# Patient Record
Sex: Female | Born: 1965 | Race: White | Hispanic: No | Smoking: Former smoker
Health system: Southern US, Community
[De-identification: ages and names within clinical notes are randomized; demographics above are authoritative.]

## PROBLEM LIST (undated history)

## (undated) DIAGNOSIS — E7800 Pure hypercholesterolemia, unspecified: Secondary | ICD-10-CM

## (undated) DIAGNOSIS — R519 Headache, unspecified: Secondary | ICD-10-CM

## (undated) DIAGNOSIS — Z9889 Other specified postprocedural states: Secondary | ICD-10-CM

## (undated) DIAGNOSIS — Z7409 Other reduced mobility: Secondary | ICD-10-CM

## (undated) DIAGNOSIS — M1990 Unspecified osteoarthritis, unspecified site: Secondary | ICD-10-CM

## (undated) DIAGNOSIS — Z87442 Personal history of urinary calculi: Secondary | ICD-10-CM

## (undated) DIAGNOSIS — R51 Headache: Secondary | ICD-10-CM

## (undated) DIAGNOSIS — S329XXA Fracture of unspecified parts of lumbosacral spine and pelvis, initial encounter for closed fracture: Secondary | ICD-10-CM

## (undated) DIAGNOSIS — I10 Essential (primary) hypertension: Secondary | ICD-10-CM

## (undated) DIAGNOSIS — F329 Major depressive disorder, single episode, unspecified: Secondary | ICD-10-CM

## (undated) DIAGNOSIS — F32A Depression, unspecified: Secondary | ICD-10-CM

## (undated) HISTORY — PX: FRACTURE SURGERY: SHX138

## (undated) HISTORY — PX: DILATION AND CURETTAGE OF UTERUS: SHX78

## (undated) HISTORY — PX: FEMUR SURGERY: SHX943

## (undated) HISTORY — DX: Pure hypercholesterolemia, unspecified: E78.00

## (undated) HISTORY — PX: WISDOM TOOTH EXTRACTION: SHX21

---

## 1998-04-04 ENCOUNTER — Emergency Department (HOSPITAL_COMMUNITY): Admission: EM | Admit: 1998-04-04 | Discharge: 1998-04-04 | Payer: Self-pay | Admitting: Emergency Medicine

## 2001-09-05 ENCOUNTER — Encounter: Payer: Self-pay | Admitting: Emergency Medicine

## 2001-09-05 ENCOUNTER — Emergency Department (HOSPITAL_COMMUNITY): Admission: EM | Admit: 2001-09-05 | Discharge: 2001-09-05 | Payer: Self-pay | Admitting: Emergency Medicine

## 2002-01-20 ENCOUNTER — Encounter (INDEPENDENT_AMBULATORY_CARE_PROVIDER_SITE_OTHER): Payer: Self-pay

## 2002-01-20 ENCOUNTER — Ambulatory Visit (HOSPITAL_COMMUNITY): Admission: AD | Admit: 2002-01-20 | Discharge: 2002-01-20 | Payer: Self-pay | Admitting: Obstetrics and Gynecology

## 2002-12-18 ENCOUNTER — Inpatient Hospital Stay (HOSPITAL_COMMUNITY): Admission: AD | Admit: 2002-12-18 | Discharge: 2002-12-18 | Payer: Self-pay | Admitting: Obstetrics and Gynecology

## 2003-02-07 ENCOUNTER — Emergency Department (HOSPITAL_COMMUNITY): Admission: EM | Admit: 2003-02-07 | Discharge: 2003-02-07 | Payer: Self-pay | Admitting: Emergency Medicine

## 2003-02-08 ENCOUNTER — Inpatient Hospital Stay (HOSPITAL_COMMUNITY): Admission: AD | Admit: 2003-02-08 | Discharge: 2003-02-08 | Payer: Self-pay | Admitting: Obstetrics and Gynecology

## 2003-03-10 ENCOUNTER — Ambulatory Visit (HOSPITAL_COMMUNITY): Admission: RE | Admit: 2003-03-10 | Discharge: 2003-03-10 | Payer: Self-pay | Admitting: Obstetrics and Gynecology

## 2003-03-24 ENCOUNTER — Observation Stay (HOSPITAL_COMMUNITY): Admission: AD | Admit: 2003-03-24 | Discharge: 2003-03-25 | Payer: Self-pay | Admitting: Obstetrics and Gynecology

## 2003-03-24 ENCOUNTER — Encounter: Payer: Self-pay | Admitting: Obstetrics and Gynecology

## 2003-03-30 ENCOUNTER — Encounter: Payer: Self-pay | Admitting: Obstetrics and Gynecology

## 2003-03-30 ENCOUNTER — Inpatient Hospital Stay (HOSPITAL_COMMUNITY): Admission: AD | Admit: 2003-03-30 | Discharge: 2003-03-30 | Payer: Self-pay | Admitting: Obstetrics and Gynecology

## 2003-04-10 ENCOUNTER — Encounter: Payer: Self-pay | Admitting: Obstetrics and Gynecology

## 2003-04-10 ENCOUNTER — Inpatient Hospital Stay (HOSPITAL_COMMUNITY): Admission: AD | Admit: 2003-04-10 | Discharge: 2003-04-10 | Payer: Self-pay | Admitting: Obstetrics and Gynecology

## 2003-04-11 ENCOUNTER — Inpatient Hospital Stay (HOSPITAL_COMMUNITY): Admission: AD | Admit: 2003-04-11 | Discharge: 2003-04-11 | Payer: Self-pay | Admitting: Obstetrics and Gynecology

## 2003-04-12 ENCOUNTER — Inpatient Hospital Stay (HOSPITAL_COMMUNITY): Admission: AD | Admit: 2003-04-12 | Discharge: 2003-04-12 | Payer: Self-pay | Admitting: Obstetrics and Gynecology

## 2003-04-18 ENCOUNTER — Inpatient Hospital Stay (HOSPITAL_COMMUNITY): Admission: AD | Admit: 2003-04-18 | Discharge: 2003-04-18 | Payer: Self-pay | Admitting: Obstetrics and Gynecology

## 2003-04-19 ENCOUNTER — Inpatient Hospital Stay (HOSPITAL_COMMUNITY): Admission: AD | Admit: 2003-04-19 | Discharge: 2003-04-19 | Payer: Self-pay | Admitting: Obstetrics and Gynecology

## 2003-04-21 ENCOUNTER — Ambulatory Visit (HOSPITAL_COMMUNITY): Admission: RE | Admit: 2003-04-21 | Discharge: 2003-04-21 | Payer: Self-pay | Admitting: Obstetrics and Gynecology

## 2003-04-21 ENCOUNTER — Encounter: Payer: Self-pay | Admitting: Obstetrics and Gynecology

## 2003-04-21 ENCOUNTER — Inpatient Hospital Stay (HOSPITAL_COMMUNITY): Admission: AD | Admit: 2003-04-21 | Discharge: 2003-04-21 | Payer: Self-pay | Admitting: Obstetrics & Gynecology

## 2003-04-23 ENCOUNTER — Encounter: Payer: Self-pay | Admitting: Obstetrics and Gynecology

## 2003-04-23 ENCOUNTER — Inpatient Hospital Stay (HOSPITAL_COMMUNITY): Admission: AD | Admit: 2003-04-23 | Discharge: 2003-04-25 | Payer: Self-pay | Admitting: Obstetrics and Gynecology

## 2003-04-24 ENCOUNTER — Encounter: Payer: Self-pay | Admitting: Obstetrics and Gynecology

## 2003-04-30 ENCOUNTER — Inpatient Hospital Stay (HOSPITAL_COMMUNITY): Admission: AD | Admit: 2003-04-30 | Discharge: 2003-05-03 | Payer: Self-pay | Admitting: Obstetrics and Gynecology

## 2003-04-30 ENCOUNTER — Encounter (INDEPENDENT_AMBULATORY_CARE_PROVIDER_SITE_OTHER): Payer: Self-pay | Admitting: *Deleted

## 2003-05-04 ENCOUNTER — Encounter: Admission: RE | Admit: 2003-05-04 | Discharge: 2003-06-03 | Payer: Self-pay | Admitting: Obstetrics and Gynecology

## 2003-05-08 ENCOUNTER — Inpatient Hospital Stay (HOSPITAL_COMMUNITY): Admission: AD | Admit: 2003-05-08 | Discharge: 2003-05-08 | Payer: Self-pay | Admitting: *Deleted

## 2003-06-12 ENCOUNTER — Other Ambulatory Visit: Admission: RE | Admit: 2003-06-12 | Discharge: 2003-06-12 | Payer: Self-pay | Admitting: Obstetrics and Gynecology

## 2003-09-07 ENCOUNTER — Emergency Department (HOSPITAL_COMMUNITY): Admission: EM | Admit: 2003-09-07 | Discharge: 2003-09-07 | Payer: Self-pay | Admitting: Emergency Medicine

## 2003-10-24 ENCOUNTER — Emergency Department (HOSPITAL_COMMUNITY): Admission: AD | Admit: 2003-10-24 | Discharge: 2003-10-24 | Payer: Self-pay | Admitting: Family Medicine

## 2004-02-20 ENCOUNTER — Emergency Department (HOSPITAL_COMMUNITY): Admission: AD | Admit: 2004-02-20 | Discharge: 2004-02-20 | Payer: Self-pay | Admitting: Internal Medicine

## 2004-04-11 ENCOUNTER — Emergency Department (HOSPITAL_COMMUNITY): Admission: EM | Admit: 2004-04-11 | Discharge: 2004-04-11 | Payer: Self-pay | Admitting: Emergency Medicine

## 2005-01-06 ENCOUNTER — Emergency Department (HOSPITAL_COMMUNITY): Admission: EM | Admit: 2005-01-06 | Discharge: 2005-01-06 | Payer: Self-pay | Admitting: Family Medicine

## 2005-03-03 ENCOUNTER — Emergency Department (HOSPITAL_COMMUNITY): Admission: EM | Admit: 2005-03-03 | Discharge: 2005-03-03 | Payer: Self-pay | Admitting: Family Medicine

## 2005-05-11 ENCOUNTER — Emergency Department (HOSPITAL_COMMUNITY): Admission: EM | Admit: 2005-05-11 | Discharge: 2005-05-11 | Payer: Self-pay | Admitting: Emergency Medicine

## 2005-05-20 ENCOUNTER — Emergency Department (HOSPITAL_COMMUNITY): Admission: EM | Admit: 2005-05-20 | Discharge: 2005-05-21 | Payer: Self-pay | Admitting: Emergency Medicine

## 2005-06-18 ENCOUNTER — Emergency Department (HOSPITAL_COMMUNITY): Admission: EM | Admit: 2005-06-18 | Discharge: 2005-06-18 | Payer: Self-pay | Admitting: Family Medicine

## 2005-06-20 ENCOUNTER — Emergency Department (HOSPITAL_COMMUNITY): Admission: EM | Admit: 2005-06-20 | Discharge: 2005-06-20 | Payer: Self-pay | Admitting: Emergency Medicine

## 2005-06-29 ENCOUNTER — Ambulatory Visit (HOSPITAL_COMMUNITY): Admission: RE | Admit: 2005-06-29 | Discharge: 2005-06-29 | Payer: Self-pay | Admitting: Urology

## 2008-05-05 ENCOUNTER — Emergency Department (HOSPITAL_COMMUNITY): Admission: EM | Admit: 2008-05-05 | Discharge: 2008-05-05 | Payer: Self-pay | Admitting: Family Medicine

## 2008-08-05 ENCOUNTER — Emergency Department (HOSPITAL_COMMUNITY): Admission: EM | Admit: 2008-08-05 | Discharge: 2008-08-05 | Payer: Self-pay | Admitting: Family Medicine

## 2011-04-07 NOTE — H&P (Signed)
NAME:  Emily Romero, Emily Romero                        ACCOUNT NO.:  [id number]   MEDICAL RECORD NO.:  [id number]                   PATIENT TYPE:  INP   LOCATION:  NA                                   FACILITY:  [facility]   PHYSICIAN:  Juluis Mire, M.D.                DATE OF BIRTH:  11-28-1965   DATE OF ADMISSION:  DATE OF DISCHARGE:                                HISTORY & PHYSICAL   HISTORY OF PRESENT ILLNESS:  The patient is a 45 year old gravida 3, para 3,  abortus 1 white female who presents for repeat cesarean section.  Her last  menstrual period was August 28, 2002 giving her an estimated date of  confinement of June 04, 2003.  This gives her an estimated gestational age  of [redacted] weeks.  This is consistent with initial exam and prior ultrasound.   Her prenatal course has been complicated by elevated blood pressures.  We  felt that she probably had some component of chronic hypertension.  She has  been on labetalol for this.  She has also had low platelet counts.  Platelet  counts have varied from 112,000 up to 137,000.  During this time she has  been hospitalized several times for management of what we felt was toxemia.  Workup was completely negative except for the hypertension and the low  platelet counts.  Her liver function tests were normal.  Serial ultrasounds  and nonstress testing revealed no evidence of intrauterine placental  insufficiency.  Last Friday we did do an amniocentesis.  The LS was 2-1 with  no PG.  We have waited a week out but now we will proceed with a repeat  cesarean section.   ALLERGIES:  No known drug allergies.   MEDICATIONS:  1. Labetalol.  2. Prenatal vitamins.   PRENATAL LABS:  The patient is O negative.  Negative antibody screen.  Nonreactive serology.  Positive Rubella titer.  Positive Rubella titer.  Negative hepatitis B surface antigen.  A 50 g Glucola was within normal  limits.   PAST MEDICAL HISTORY:   FAMILY HISTORY:   SOCIAL HISTORY:   Please see prenatal records.  The only issue was she was at  risk for advanced maternal age and did have an amniocentesis with  ___________ chromosomally normal female.   REVIEW OF SYSTEMS:  Noncontributory.   PHYSICAL EXAMINATION:  VITAL SIGNS:  The patient's blood pressure is  160/100.  The vital signs are stable.  HEENT:  The patient normocephalic.  Pupils equal, round, and reactive to  light and accommodation.  Extraocular movements intact.  Sclerae and  conjunctivae clear.  Oropharynx clear.  NECK:  Without thyromegaly.  BREASTS:  ___________ but no discreet masses.  LUNGS:  Clear.  HEART:  Regular rate.  Grade 2/6 soft systolic ejection murmur.  ABDOMEN:  Gravid uterus consistent with dates.  PELVIC:  Examination deferred.  EXTREMITIES:  Trace edema.  NEUROLOGIC:  Grossly within normal limits.  Deep tendon reflexes 2+.  No  clonus.   IMPRESSION:  1. Intrauterine pregnancy at 35 weeks.  2. Chronic hypertension.  3. Thrombocytopenia.  4. Planned repeat cesarean section.   PLAN:  The patient will undergo repeat cesarean section.  The risks were  discussed including the small risk of pulmonary hematuria that could require  transfer of the infant to the intensive care unit for management.  The risks  of surgery have been discussed including the risk of infection.  The risk of  hemorrhage can necessitate transfusion with the risk of major hepatitis.  The risk of injury to adjacent organs including bladder, bowel, or ureters  could require further exploratory surgery.  The risks of ___________ and  pulmonary embolus.  The patient voiced understanding of the examination  risks.                                               Juluis Mire, M.D.    JSM/MEDQ  D:  04/30/2003  T:  04/30/2003  Job:  [id number]

## 2011-04-07 NOTE — Op Note (Signed)
[facility]  Patient:    Emily Romero, Emily Romero Visit Number: [id number] MRN: [id number]          Service Type: DSU Location: DAY Attending Physician:  Lendon Colonel Dictated by:   Kathie Rhodes. Kyra Manges, M.D. Proc. Date: 01/20/02 Admit Date:  01/20/2002 Discharge Date: 01/20/2002                             Operative Report  PREOPERATIVE DIAGNOSES:  Incomplete abortion.  POSTOPERATIVE DIAGNOSES:  Incomplete abortion.  OPERATION PERFORMED:  Suction curettage.  DESCRIPTION OF PROCEDURE:  Under satisfactory general anesthesia, the patient was prepped and draped in the usual fashion. Cervix dilated and the endometrial cavity was emptied with blood clots and retained products of conception. No unusual blood loss occurred. The patient tolerated the procedure well and was sent to the recovery room in good condition. Dictated by:   S. Kyra Manges, M.D. Attending Physician:  Lendon Colonel DD:  01/20/02 TD:  01/21/02 Job: [id number]

## 2011-04-07 NOTE — Op Note (Signed)
NAME:  Emily Romero, Emily Romero                        ACCOUNT NO.:  [id number]   MEDICAL RECORD NO.:  [id number]                   PATIENT TYPE:  INP   LOCATION:  9146                                 [facility]ACILITY:  [facility]   PHYSICIAN:  Juluis Mire, M.D.                DATE OF BIRTH:  Apr 11, 1966   DATE OF PROCEDURE:  04/30/2003  DATE OF DISCHARGE:                                 OPERATIVE REPORT   PREOPERATIVE DIAGNOSES:  1. Intrauterine pregnancy at 36 weeks.  2. Chronic hypertension.  3. Thrombocytopenia.  4. Repeat cesarean section.  5. Multiparity, desires sterility.   POSTOPERATIVE DIAGNOSES:  1. Intrauterine pregnancy at 36 weeks.  2. Chronic hypertension.  3. Thrombocytopenia.  4. Repeat cesarean section.  5. Multiparity, desires sterility.   OPERATIVE PROCEDURES:  1. Low transverse cesarean section.  2. Bilateral tubal ligation.   SURGEON:  Juluis Mire, M.D.   ANESTHESIA:  Spinal.   ESTIMATED BLOOD LOSS:  800 mL.   PACKS AND DRAINS:  None.   FLUIDS REPLACED:  There was no intraoperative blood placed.   COMPLICATIONS:  None.   INDICATIONS FOR PROCEDURE:  This is as noted in history and physical with  one addition.  The patient decided she was desirous of permanent  sterilization.  Potential irreversibility of sterilization is explained.  A  failure rate of 1 in 200 is quoted.  Failures can be in the form of ectopic  pregnancy requiring further surgical management.  Alternative forms of birth  control have been discussed.   DESCRIPTION OF PROCEDURE:  The patient was taken to the operating room and  placed in the supine position with left lateral tilt.  After satisfactory  level of spinal anesthesia was obtained, the abdomen was prepped out with  Betadine and draped as a sterile field.  A prior low transverse skin  incision was then excised. The incision was extended through subcutaneous  tissue.  Fascia was entered sharply and incision in the fascia extended  laterally.  The fascia was taken off the muscles superiorly and inferiorly.  Rectus muscles were separated in the midline.  Peritoneum was entered  sharply.  Incision in the peritoneum was extended both superiorly and  inferiorly.  A low transverse bladder flap was developed.  A low transverse  uterine incision was begun with the knife and extended laterally using  manual traction.  The infant was delivered with elevation _______ fundal  pressure.  Amniotic fluid was clear.  The infant was a viable female who  weighed 6 pounds 7 ounces, Apgars were 8/9.  Umbilical artery pH was 7.33.  Placenta was delivered manually and sent for pathology.  Uterus was then  exteriorized for closure.  Uterus closed with running locking suture of 0  chromic using a two-layer closure technique.  We had good hemostasis and  clear urine output.   The patient was desirous of permanent sterilization.  Mid  segment of each  tube was elevated with Babcock tenaculum.  A hole was made in the avascular  of the mesosalpinx using the bipolar.  Individual ligatures of 0 plain  catgut were used to ligate off a segment of tube.  The intervening segment  of tube was then excised.  Cut ends of the tube were then cauterized using  the Bovie.  We had good hemostasis.  Ovaries appeared to be normal.  Uterus  was returned to the abdominal cavity.  We thoroughly irrigated the pelvis.  We had excellent hemostasis.  Urine output remained clear and adequate.  Muscles reapproximated with running suture of 3-0 Vicryl.  Fascia was closed  with running suture of 0 PDS.  Skin was closed with staples and Steri-  Strips.  Sponge, needle and instrument counts were reported as correct by  the circulating nurse x2.  The patient tolerated the procedure well and was  returned to the recovery room in good condition.                                               Juluis Mire, M.D.    JSM/MEDQ  D:  04/30/2003  T:  05/01/2003  Job:   [id number]

## 2011-04-07 NOTE — Discharge Summary (Signed)
NAME:  Emily Romero, Emily Romero                        ACCOUNT NO.:  [id number]   MEDICAL RECORD NO.:  [id number]                   PATIENT TYPE:  INP   LOCATION:  9146                                 FACILITY:  [facility]   PHYSICIAN:  Duke Salvia. Marcelle Overlie, M.D.            DATE OF BIRTH:  24-Apr-1966   DATE OF ADMISSION:  04/30/2003  DATE OF DISCHARGE:  05/03/2003                                 DISCHARGE SUMMARY   ADMITTING DIAGNOSES:  1. Intrauterine pregnancy at 28 weeks estimated gestational age.  2. Chronic hypertension.  3. Thrombocytopenia.  4. Previous cesarean delivery, desires repeat.  5. Multiparity, desires sterilization.   DISCHARGE DIAGNOSES:  1. Status post repeat low transverse cesarean section.  2. Viable female infant.  3. Permanent sterilization.   PROCEDURE:  1. Repeat low transverse cesarean section.  2. Bilateral tubal ligation.   REASON FOR ADMISSION:  Please see dictated H&P.   HOSPITAL COURSE:  The patient is a 45 year old, G3, P1, who presented to  [facility] for a scheduled repeat cesarean delivery.  Prenatal course had been complicated by chronic hypertension.  The patient  was placed on Labetalol.  The patient was also noted to have a low platelet  count ranging from 112 to 137,000.  The patient had been hospitalized  several times during her pregnancy for management of questionable toxemia.  Work-up was negative except for hypertension and low platelet count.  Liver  function tests were within normal limits.  Serial ultrasound and nonstress  test revealed no evidence of intrauterine placental insufficiency.  Amniocentesis was performed approximately one week ago with an LS ratio of  2:1 without PG.  The patient presents to the hospital on the day of  admission for a scheduled cesarean delivery at 36 weeks estimated  gestational age.  On the morning of admission, the patient was prepped  accordingly and taken to the operating room where spinal  anesthesia was  administered without difficulty.  A low transverse incision was made with  delivery of a viable female infant weighing 6 pounds, 7 ounces, Apgars of 8  at one minute, 9 at five minutes.  Umbilical cord pH was 7.33.  Bilateral  tubal ligation was performed, and the patient tolerated the procedure well  and was taken to the recovery room in stable condition.   On postoperative day one, the patient was without complaint.  She had good  return of bowel function.  Her vital signs were stable.  Blood pressure was  183/91.  Deep tendon reflexes were 1+ bilaterally.  Fundus was firm and  nontender.  Incision was clean, dry, and intact with slight ecchymosis noted  left of the midline superior to the incisional site.  Labs reveal a  hemoglobin of 10.0, platelet count of 116,000, WBC count of 15.3.  The  patient was restarted on Labetalol 100 mg b.i.d.   On postoperative day two, the patient was ambulating well,  she was  tolerating a regular diet, without complaints of nausea or vomiting.  Vital  signs were stable, blood pressure 130/88.  Labs revealed hemoglobin of 10.0,  platelet count up to 120,000.   On postoperative day three, the patient was doing well, fundus was firm and  nontender, incision was clean, dry, and intact, staples were removed, and  the patient was discharged home.   CONDITION ON DISCHARGE:  Stable.   DIET:  Regular as tolerated.   ACTIVITY:  No heavy lifting, no driving x2 weeks, no vaginal injury.   FOLLOW UP:  The patient is to follow up in the office in two days for a  blood pressure check and repeat platelet count.  She is to call for  temperature greater than 100 degrees, persistent nausea and vomiting, heavy  vaginal bleeding, any redness or drainage of the incisional site.   DISCHARGE MEDICATIONS:  1. Tylox x30 one p.o. q.4-6h. p.r.n.  2. Motrin 600 mg q.6h. p.r.n.  3. Labetalol 100 mg one p.o. b.i.d.  4. Prenatal vitamins one p.o. daily.  5.  Colace one p.o. daily p.r.n.     Julio Sicks, N.P.                        Richard M. Marcelle Overlie, M.D.    CC/MEDQ  D:  05/18/2003  T:  05/18/2003  Job:  [id number]

## 2011-04-07 NOTE — H&P (Signed)
NAME:  Emily Romero, Emily Romero                        ACCOUNT NO.:  [id number]   MEDICAL RECORD NO.:  [id number]                   PATIENT TYPE:  MAT   LOCATION:  MATC                                 [facility]ACILITY:  [facility]   PHYSICIAN:  Duke Salvia. Marcelle Overlie, M.D.            DATE OF BIRTH:  [birth year]   DATE OF ADMISSION:  04/23/2003  DATE OF DISCHARGE:                                HISTORY & PHYSICAL   CHIEF COMPLAINT:  Headache, elevated blood pressure.   HISTORY OF PRESENT ILLNESS:  A 45 year old G3, P1-0-1-1, at 34-1/7 weeks.  The patient has chronic hypertension.  She did have an amniocentesis with  normal results.  This pregnancy has been complicated by reflux for which she  has used antacids and Pepcid AC, along with Zantac.  Her one hour GTT was  126.  She was admitted in early May.  Pregnancy induced hypertension labs  were normal except for a platelet count of 111,000, and she was started on  labetalol 100 mg p.o. b.i.d.   Two days later, blood pressure was 138/90, and she has been followed weekly  with non-stress testing and serial laboratory work.  She has had normal  growth by ultrasound.  When seen on April 21, 2003, blood pressure was 150/84,  trace protein.  She was complaining of some headache at that time.  She did  receive betamethasone the weekend prior to that time.  On followup  examination today, blood pressure is 140/98, still had a reactive NST, was  complaining of headache.   In triage, her blood pressure was 160/108, her headache resolved with Extra  Strength Tylenol, and she did have pregnancy induced hypertension labs that  were repeated and results were still pending.  Plan made to proceed with  admission.  Amniocentesis tomorrow with possible delivery.  She has had a  previous cesarean section, and would be scheduled for repeat cesarean  section.   ALLERGIES:  No known drug allergies.   PAST SURGICAL HISTORY:  1. Cesarean section in 1998.  2. D&E for a SAB in  2001.  3. Fractured pelvis that required some orthopaedic surgery.   REVIEW OF SYMPTOMS:  Significant for prior history of pregnancy induced  hypertension.  She has a history of smoking, and history of prior femur  fracture, right and left, at the time of her fractured pelvis.   PHYSICAL EXAMINATION:  VITAL SIGNS:  Temperature 98.2, blood pressure  150/108.  HEENT:  Unremarkable.  NECK:  Supple without masses.  LUNGS:  Clear.  CARDIOVASCULAR:  Regular rate and rhythm without murmurs, rubs, or gallops.  BREASTS:  Not examined.  PELVIC:  Fetal heart rate was 140, 36 cm fundal height, cervix was closed.  EXTREMITIES:  Unremarkable.  NEUROLOGIC:  Unremarkable.   IMPRESSION:  1. A 34-1/7 week intrauterine pregnancy.  2. Previous cesarean section.  3. Chronic hypertension with pre-existing gestational thrombocytopenia, this     has been  stable.   PLAN:  We will admit for ultrasound, biophysical profile, repeat pregnancy  induced hypertension labs, and plan amniocentesis for lung maturity in the  morning with delivery then if mature.                                               Richard M. Marcelle Overlie, M.D.    RMH/MEDQ  D:  04/23/2003  T:  04/23/2003  Job:  [id number]

## 2011-04-07 NOTE — Discharge Summary (Signed)
   NAME:  Emily Romero, Emily Romero                        ACCOUNT NO.:  [id number]   MEDICAL RECORD NO.:  [id number]                   PATIENT TYPE:  INP   LOCATION:  9144                                 [facility]ACILITY[facility]   PHYSICIAN:  Juluis Mire, M.D.                DATE OF BIRTH:  12-02-1965   DATE OF ADMISSION:  04/23/2003  DATE OF DISCHARGE:  04/25/2003                                 DISCHARGE SUMMARY   ADMISSION DIAGNOSES:  1. Intrauterine pregnancy at 34+ weeks with chronic hypertension.  2. Chronic thrombocytopenia.   DISCHARGE DIAGNOSES:  1. Intrauterine pregnancy at 34+ weeks with chronic hypertension.  2. Chronic thrombocytopenia.   HISTORY AND PHYSICAL:  For complete history and physical, please see  dictated note.   HOSPITAL COURSE:  The patient brought in for evaluation. Ultrasound  evaluation did reveal a biophysical profile of 8 over 8 with normal fetal  Dopplers. Amniotic fluid was normal. She was monitored with a nice reactive  tracing. Blood pressure remained elevated despite Labetalol. Blood work was  basically unchanged. Her platelet count remained slightly depressed at  130,000, but this was actually higher than it had previously been. Liver  function tests were normal. She underwent amniocentesis on Friday. LS was  2:1 with no PG. The following day, her blood pressures were basically stable  for her. She remained on Labetalol. Repeat platelet count was 130,000. An  NST was reactive. She is discharged to home at this time.   COMPLICATIONS:  None.   CONDITION ON DISCHARGE:  The patient is discharged to home in stable  condition.   DISPOSITION:  PIH warning is given. To home. Call if headaches, visual  disturbances, right upper quadrant pain or decreased fetal movement.   INSTRUCTIONS:  Discharged to home to continue Labetalol and rest. Will  follow up in the office on Monday.                                               Juluis Mire, M.D.    JSM/MEDQ   D:  04/25/2003  T:  04/25/2003  Job:  [id number]

## 2014-06-15 ENCOUNTER — Other Ambulatory Visit: Payer: Self-pay | Admitting: Internal Medicine

## 2014-06-15 DIAGNOSIS — Z1231 Encounter for screening mammogram for malignant neoplasm of breast: Secondary | ICD-10-CM

## 2014-07-14 ENCOUNTER — Ambulatory Visit
Admission: RE | Admit: 2014-07-14 | Discharge: 2014-07-14 | Disposition: A | Discharge status: Home / Self Care | Payer: Medicaid Other | Source: Ambulatory Visit | Attending: Internal Medicine | Admitting: Internal Medicine

## 2014-07-14 DIAGNOSIS — Z1231 Encounter for screening mammogram for malignant neoplasm of breast: Secondary | ICD-10-CM

## 2014-12-10 ENCOUNTER — Other Ambulatory Visit: Payer: Self-pay | Admitting: Internal Medicine

## 2014-12-10 DIAGNOSIS — N6452 Nipple discharge: Secondary | ICD-10-CM

## 2014-12-10 DIAGNOSIS — N644 Mastodynia: Secondary | ICD-10-CM

## 2014-12-17 ENCOUNTER — Ambulatory Visit
Admission: RE | Admit: 2014-12-17 | Discharge: 2014-12-17 | Disposition: A | Discharge status: Home / Self Care | Payer: Medicaid Other | Source: Ambulatory Visit | Attending: Internal Medicine | Admitting: Internal Medicine

## 2014-12-17 DIAGNOSIS — N644 Mastodynia: Secondary | ICD-10-CM

## 2014-12-17 DIAGNOSIS — N6452 Nipple discharge: Secondary | ICD-10-CM

## 2015-04-02 ENCOUNTER — Other Ambulatory Visit: Payer: Self-pay | Admitting: Surgery

## 2015-06-28 ENCOUNTER — Other Ambulatory Visit: Payer: Self-pay

## 2015-06-28 DIAGNOSIS — Z1231 Encounter for screening mammogram for malignant neoplasm of breast: Secondary | ICD-10-CM

## 2015-07-19 ENCOUNTER — Ambulatory Visit
Admission: RE | Admit: 2015-07-19 | Discharge: 2015-07-19 | Disposition: A | Discharge status: Home / Self Care | Payer: Medicaid Other | Source: Ambulatory Visit

## 2015-07-19 DIAGNOSIS — Z1231 Encounter for screening mammogram for malignant neoplasm of breast: Secondary | ICD-10-CM

## 2015-07-20 ENCOUNTER — Other Ambulatory Visit: Payer: Self-pay | Admitting: Internal Medicine

## 2015-07-20 DIAGNOSIS — R928 Other abnormal and inconclusive findings on diagnostic imaging of breast: Secondary | ICD-10-CM

## 2015-07-28 ENCOUNTER — Ambulatory Visit
Admission: RE | Admit: 2015-07-28 | Discharge: 2015-07-28 | Disposition: A | Discharge status: Home / Self Care | Payer: Medicaid Other | Source: Ambulatory Visit | Attending: Internal Medicine | Admitting: Internal Medicine

## 2015-07-28 DIAGNOSIS — R928 Other abnormal and inconclusive findings on diagnostic imaging of breast: Secondary | ICD-10-CM

## 2016-06-29 ENCOUNTER — Other Ambulatory Visit: Payer: Self-pay | Admitting: Internal Medicine

## 2016-06-29 DIAGNOSIS — Z1231 Encounter for screening mammogram for malignant neoplasm of breast: Secondary | ICD-10-CM

## 2016-07-19 ENCOUNTER — Ambulatory Visit
Admission: RE | Admit: 2016-07-19 | Discharge: 2016-07-19 | Disposition: A | Discharge status: Home / Self Care | Payer: Medicaid Other | Source: Ambulatory Visit | Attending: Internal Medicine | Admitting: Internal Medicine

## 2016-07-19 DIAGNOSIS — Z1231 Encounter for screening mammogram for malignant neoplasm of breast: Secondary | ICD-10-CM

## 2016-10-24 ENCOUNTER — Other Ambulatory Visit: Payer: Self-pay | Admitting: Internal Medicine

## 2016-10-24 DIAGNOSIS — N63 Unspecified lump in unspecified breast: Secondary | ICD-10-CM

## 2016-10-27 ENCOUNTER — Ambulatory Visit
Admission: RE | Admit: 2016-10-27 | Discharge: 2016-10-27 | Disposition: A | Discharge status: Home / Self Care | Payer: Medicaid Other | Source: Ambulatory Visit | Attending: Internal Medicine | Admitting: Internal Medicine

## 2016-10-27 DIAGNOSIS — N63 Unspecified lump in unspecified breast: Secondary | ICD-10-CM

## 2016-11-03 ENCOUNTER — Encounter (HOSPITAL_COMMUNITY): Payer: Self-pay

## 2016-11-03 NOTE — Patient Instructions (Addendum)
Your procedure is scheduled on:  Wednesday, Dec. 27, 2017  [facility]:  6:00 AM  Pick up the phone at the desk and dial [phone].  Call this number if you have problems the morning of surgery: [phone].  Remember: Do NOT eat food or drink after:  Midnight Tuesday, Dec. 26, 2017  Take these medicines the morning of surgery with a SIP OF WATER:  Wellbutrin, Olmesartan  Stop ALL herbal medications at this time  Do NOT smoke day of surgery  Do NOT wear jewelry (body piercing), metal hair clips/bobby pins, make-up, or nail polish. Do NOT wear lotions, powders, or perfumes.  You may wear deodorant. Do NOT shave for 48 hours prior to surgery. Do NOT bring valuables to the hospital. Contacts, dentures, or bridgework may not be worn into surgery.  Have a responsible adult drive you home and stay with you for 24 hours after your procedure

## 2016-11-06 ENCOUNTER — Encounter (HOSPITAL_COMMUNITY)
Admission: RE | Admit: 2016-11-06 | Discharge: 2016-11-06 | Disposition: A | Discharge status: Home / Self Care | Payer: Medicaid Other | Source: Ambulatory Visit | Attending: Obstetrics and Gynecology | Admitting: Obstetrics and Gynecology

## 2016-11-06 ENCOUNTER — Encounter (HOSPITAL_COMMUNITY): Payer: Self-pay

## 2016-11-06 DIAGNOSIS — Z01812 Encounter for preprocedural laboratory examination: Secondary | ICD-10-CM | POA: Diagnosis not present

## 2016-11-06 DIAGNOSIS — N920 Excessive and frequent menstruation with regular cycle: Secondary | ICD-10-CM | POA: Insufficient documentation

## 2016-11-06 HISTORY — DX: Essential (primary) hypertension: I10

## 2016-11-06 HISTORY — DX: Headache: R51

## 2016-11-06 HISTORY — DX: Major depressive disorder, single episode, unspecified: F32.9

## 2016-11-06 HISTORY — DX: Other reduced mobility: Z74.09

## 2016-11-06 HISTORY — DX: Fracture of unspecified parts of lumbosacral spine and pelvis, initial encounter for closed fracture: S32.9XXA

## 2016-11-06 HISTORY — DX: Personal history of urinary calculi: Z87.442

## 2016-11-06 HISTORY — DX: Depression, unspecified: F32.A

## 2016-11-06 HISTORY — DX: Headache, unspecified: R51.9

## 2016-11-06 HISTORY — DX: Unspecified osteoarthritis, unspecified site: M19.90

## 2016-11-06 LAB — BASIC METABOLIC PANEL
Anion gap: 3 — ABNORMAL LOW (ref 5–15)
BUN: 12 mg/dL (ref 6–20)
CALCIUM: 8.6 mg/dL — AB (ref 8.9–10.3)
CO2: 28 mmol/L (ref 22–32)
CREATININE: 0.79 mg/dL (ref 0.44–1.00)
Chloride: 109 mmol/L (ref 101–111)
GFR calc non Af Amer: >60 mL/min (ref >60)
GLUCOSE: 92 mg/dL (ref 65–99)
Potassium: 4.2 mmol/L (ref 3.5–5.1)
Sodium: 140 mmol/L (ref 135–145)

## 2016-11-06 LAB — CBC
HEMATOCRIT: 40.1 % (ref 36.0–46.0)
Hemoglobin: 13.6 g/dL (ref 12.0–15.0)
MCH: 31.9 pg (ref 26.0–34.0)
MCHC: 33.9 g/dL (ref 30.0–36.0)
MCV: 94.1 fL (ref 78.0–100.0)
Platelets: 199 10*3/uL (ref 150–400)
RBC: 4.26 MIL/uL (ref 3.87–5.11)
RDW: 13 % (ref 11.5–15.5)
WBC: 9.5 10*3/uL (ref 4.0–10.5)
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
## 2016-11-14 NOTE — Anesthesia Preprocedure Evaluation (Addendum)
Anesthesia Evaluation  Patient identified by MRN, date of birth, ID band Patient awake    Reviewed: Allergy & Precautions, NPO status , Patient's Chart, lab work & pertinent test results  History of Anesthesia Complications Negative for: history of anesthetic complications  Airway Mallampati: II  TM Distance: >3 FB Neck ROM: Full    Dental  (+) Dental Advisory Given   Pulmonary Current Smoker,    breath sounds clear to auscultation       Cardiovascular hypertension, Pt. on medications and Pt. on home beta blockers (-) angina Rhythm:Regular Rate:Normal     Neuro/Psych  Headaches, Depression    GI/Hepatic negative GI ROS, Neg liver ROS,   Endo/Other  Morbid obesity  Renal/GU negative Renal ROS     Musculoskeletal  (+) Arthritis ,   Abdominal (+) + obese,   Peds  Hematology negative hematology ROS (+)   Anesthesia Other Findings   Reproductive/Obstetrics                            Anesthesia Physical Anesthesia Plan  ASA: II  Anesthesia Plan: General   Post-op Pain Management:    Induction: Intravenous  Airway Management Planned: LMA  Additional Equipment:   Intra-op Plan:   Post-operative Plan:   Informed Consent: I have reviewed the patients History and Physical, chart, labs and discussed the procedure including the risks, benefits and alternatives for the proposed anesthesia with the patient or authorized representative who has indicated his/her understanding and acceptance.   Dental advisory given  Plan Discussed with: CRNA and Surgeon  Anesthesia Plan Comments: (Plan routine monitors, GA- LMA OK)        Anesthesia Quick Evaluation

## 2016-11-15 ENCOUNTER — Encounter (HOSPITAL_COMMUNITY): Payer: Self-pay

## 2016-11-15 ENCOUNTER — Encounter (HOSPITAL_COMMUNITY)
Admission: RE | Disposition: A | Discharge status: Home / Self Care | Payer: Self-pay | Source: Ambulatory Visit | Attending: Obstetrics and Gynecology

## 2016-11-15 ENCOUNTER — Ambulatory Visit (HOSPITAL_COMMUNITY): Payer: Medicaid Other | Admitting: Anesthesiology

## 2016-11-15 ENCOUNTER — Ambulatory Visit (HOSPITAL_COMMUNITY)
Admission: RE | Admit: 2016-11-15 | Discharge: 2016-11-15 | Disposition: A | Discharge status: Home / Self Care | Payer: Medicaid Other | Source: Ambulatory Visit | Attending: Obstetrics and Gynecology | Admitting: Obstetrics and Gynecology

## 2016-11-15 DIAGNOSIS — Z6830 Body mass index (BMI) 30.0-30.9, adult: Secondary | ICD-10-CM | POA: Insufficient documentation

## 2016-11-15 DIAGNOSIS — N920 Excessive and frequent menstruation with regular cycle: Secondary | ICD-10-CM | POA: Insufficient documentation

## 2016-11-15 DIAGNOSIS — M47816 Spondylosis without myelopathy or radiculopathy, lumbar region: Secondary | ICD-10-CM | POA: Insufficient documentation

## 2016-11-15 DIAGNOSIS — F329 Major depressive disorder, single episode, unspecified: Secondary | ICD-10-CM | POA: Insufficient documentation

## 2016-11-15 DIAGNOSIS — Z9889 Other specified postprocedural states: Secondary | ICD-10-CM

## 2016-11-15 DIAGNOSIS — Z87442 Personal history of urinary calculi: Secondary | ICD-10-CM | POA: Insufficient documentation

## 2016-11-15 DIAGNOSIS — F17200 Nicotine dependence, unspecified, uncomplicated: Secondary | ICD-10-CM | POA: Diagnosis not present

## 2016-11-15 DIAGNOSIS — I10 Essential (primary) hypertension: Secondary | ICD-10-CM | POA: Insufficient documentation

## 2016-11-15 HISTORY — DX: Other specified postprocedural states: Z98.890

## 2016-11-15 HISTORY — PX: DILITATION & CURRETTAGE/HYSTROSCOPY WITH NOVASURE ABLATION: SHX5568

## 2016-11-15 LAB — PREGNANCY, URINE: PREG TEST UR: NEGATIVE

## 2016-11-15 SURGERY — DILATATION & CURETTAGE/HYSTEROSCOPY WITH NOVASURE ABLATION
Anesthesia: General | Site: Uterus

## 2016-11-15 MED ORDER — GLYCOPYRROLATE 0.2 MG/ML IJ SOLN
INTRAMUSCULAR | Status: AC
  Filled 2016-11-15: qty 1

## 2016-11-15 MED ORDER — SCOPOLAMINE 1 MG/3DAYS TD PT72: 1.0000 | MEDICATED_PATCH | Freq: Once | TRANSDERMAL | Status: DC

## 2016-11-15 MED ORDER — LIDOCAINE HCL 1 % IJ SOLN
INTRAMUSCULAR | Status: AC
  Filled 2016-11-15: qty 20

## 2016-11-15 MED ORDER — IBUPROFEN 800 MG PO TABS: 800.0000 mg | ORAL_TABLET | Freq: Three times a day (TID) | ORAL | 1 refills | Status: DC | PRN

## 2016-11-15 MED ORDER — PROPOFOL 10 MG/ML IV BOLUS
INTRAVENOUS | Status: AC
  Filled 2016-11-15: qty 20

## 2016-11-15 MED ORDER — MEPERIDINE HCL 25 MG/ML IJ SOLN: 6.2500 mg | INTRAMUSCULAR | Status: DC | PRN

## 2016-11-15 MED ORDER — LACTATED RINGERS IV SOLN
INTRAVENOUS | Status: DC
  Administered 2016-11-15 (×2): via INTRAVENOUS

## 2016-11-15 MED ORDER — DEXAMETHASONE SODIUM PHOSPHATE 10 MG/ML IJ SOLN
INTRAMUSCULAR | Status: DC | PRN
  Administered 2016-11-15: 4 mg via INTRAVENOUS

## 2016-11-15 MED ORDER — ONDANSETRON HCL 4 MG/2ML IJ SOLN
INTRAMUSCULAR | Status: AC
  Filled 2016-11-15: qty 2

## 2016-11-15 MED ORDER — ONDANSETRON HCL 4 MG/2ML IJ SOLN
INTRAMUSCULAR | Status: DC | PRN
  Administered 2016-11-15: 4 mg via INTRAVENOUS

## 2016-11-15 MED ORDER — MIDAZOLAM HCL 2 MG/2ML IJ SOLN
INTRAMUSCULAR | Status: AC
  Filled 2016-11-15: qty 2

## 2016-11-15 MED ORDER — DEXAMETHASONE SODIUM PHOSPHATE 4 MG/ML IJ SOLN
INTRAMUSCULAR | Status: AC
  Filled 2016-11-15: qty 1

## 2016-11-15 MED ORDER — FENTANYL CITRATE (PF) 100 MCG/2ML IJ SOLN
25.0000 ug | INTRAMUSCULAR | Status: DC | PRN
  Administered 2016-11-15: 25 ug via INTRAVENOUS
  Administered 2016-11-15: 50 ug via INTRAVENOUS

## 2016-11-15 MED ORDER — PROPOFOL 10 MG/ML IV BOLUS
INTRAVENOUS | Status: DC | PRN
  Administered 2016-11-15: 200 mg via INTRAVENOUS

## 2016-11-15 MED ORDER — SODIUM CHLORIDE 0.9 % IR SOLN
Status: DC | PRN
  Administered 2016-11-15: 1000 mL

## 2016-11-15 MED ORDER — LIDOCAINE HCL (CARDIAC) 20 MG/ML IV SOLN
INTRAVENOUS | Status: AC
  Filled 2016-11-15: qty 5

## 2016-11-15 MED ORDER — LACTATED RINGERS IV SOLN
INTRAVENOUS | Status: DC
  Administered 2016-11-15: 07:00:00 via INTRAVENOUS

## 2016-11-15 MED ORDER — MIDAZOLAM HCL 2 MG/2ML IJ SOLN: 0.5000 mg | Freq: Once | INTRAMUSCULAR | Status: DC | PRN

## 2016-11-15 MED ORDER — PROMETHAZINE HCL 25 MG/ML IJ SOLN: 6.2500 mg | INTRAMUSCULAR | Status: DC | PRN

## 2016-11-15 MED ORDER — MIDAZOLAM HCL 2 MG/2ML IJ SOLN
INTRAMUSCULAR | Status: DC | PRN
  Administered 2016-11-15: 2 mg via INTRAVENOUS

## 2016-11-15 MED ORDER — FENTANYL CITRATE (PF) 100 MCG/2ML IJ SOLN
INTRAMUSCULAR | Status: AC
  Filled 2016-11-15: qty 2

## 2016-11-15 MED ORDER — OXYCODONE HCL 5 MG PO TABS: 5.0000 mg | ORAL_TABLET | Freq: Four times a day (QID) | ORAL | 0 refills | Status: DC | PRN

## 2016-11-15 MED ORDER — FENTANYL CITRATE (PF) 100 MCG/2ML IJ SOLN
INTRAMUSCULAR | Status: DC | PRN
  Administered 2016-11-15: 100 ug via INTRAVENOUS

## 2016-11-15 MED ORDER — GLYCOPYRROLATE 0.2 MG/ML IJ SOLN
INTRAMUSCULAR | Status: DC | PRN
  Administered 2016-11-15: 0.2 mg via INTRAVENOUS

## 2016-11-15 MED ORDER — FENTANYL CITRATE (PF) 100 MCG/2ML IJ SOLN
INTRAMUSCULAR | Status: AC
  Administered 2016-11-15: 50 ug via INTRAVENOUS
  Filled 2016-11-15: qty 2

## 2016-11-15 MED ORDER — LIDOCAINE HCL (CARDIAC) 20 MG/ML IV SOLN
INTRAVENOUS | Status: DC | PRN
  Administered 2016-11-15: 50 mg via INTRAVENOUS

## 2016-11-15 MED ORDER — SCOPOLAMINE 1 MG/3DAYS TD PT72
MEDICATED_PATCH | TRANSDERMAL | Status: AC
  Filled 2016-11-15: qty 1

## 2016-11-15 MED ORDER — KETOROLAC TROMETHAMINE 30 MG/ML IJ SOLN
INTRAMUSCULAR | Status: DC | PRN
  Administered 2016-11-15: 30 mg via INTRAVENOUS

## 2016-11-15 SURGICAL SUPPLY — 15 items
ABLATOR ENDOMETRIAL BIPOLAR (ABLATOR) ×3 IMPLANT
CANISTER SUCT 3000ML (MISCELLANEOUS) ×3 IMPLANT
CATH ROBINSON RED A/P 16FR (CATHETERS) ×3 IMPLANT
CLOTH BEACON ORANGE TIMEOUT ST (SAFETY) ×3 IMPLANT
GLOVE BIO SURGEON STRL SZ 6.5 (GLOVE) ×2 IMPLANT
GLOVE BIO SURGEONS STRL SZ 6.5 (GLOVE) ×1
GLOVE BIOGEL PI IND STRL 7.0 (GLOVE) ×1 IMPLANT
GLOVE BIOGEL PI INDICATOR 7.0 (GLOVE) ×2
GOWN STRL REUS W/TWL LRG LVL3 (GOWN DISPOSABLE) ×6 IMPLANT
PACK VAGINAL MINOR WOMEN LF (CUSTOM PROCEDURE TRAY) ×3 IMPLANT
PAD OB MATERNITY 4.3X12.25 (PERSONAL CARE ITEMS) ×3 IMPLANT
TOWEL OR 17X24 6PK STRL BLUE (TOWEL DISPOSABLE) ×6 IMPLANT
TUBING AQUILEX INFLOW (TUBING) ×3 IMPLANT
TUBING AQUILEX OUTFLOW (TUBING) ×3 IMPLANT
WATER STERILE IRR 1000ML POUR (IV SOLUTION) ×3 IMPLANT

## 2016-11-15 NOTE — Anesthesia Postprocedure Evaluation (Signed)
Anesthesia Post Note  Patient: Peter GarterSherry W Piscitello  Procedure(s) Performed: Procedure(s) (LRB): NOVASURE ENDOMETRIAL  ABLATION (N/A)  Patient location during evaluation: PACU Anesthesia Type: General Level of consciousness: awake and alert, oriented and patient cooperative Pain management: pain level controlled Vital Signs Assessment: post-procedure vital signs reviewed and stable Respiratory status: spontaneous breathing, nonlabored ventilation and respiratory function stable Cardiovascular status: blood pressure returned to baseline and stable Postop Assessment: no signs of nausea or vomiting Anesthetic complications: no        Last Vitals:  Vitals:   11/15/16 0845 11/15/16 0858  BP:    Pulse: 60 (!) 49  Resp: 14 16  Temp:      Last Pain:  Vitals:   11/15/16 0845  TempSrc:   PainSc: 2    Pain Goal:                 Latia Mataya,E. Fadi Menter

## 2016-11-15 NOTE — Anesthesia Procedure Notes (Signed)
Procedure Name: LMA Insertion Date/Time: 11/15/2016 7:23 AM Performed by: Shanon PayorGREGORY, Karn Derk M Pre-anesthesia Checklist: Patient identified, Emergency Drugs available, Suction available, Timeout performed and Patient being monitored Patient Re-evaluated:Patient Re-evaluated prior to inductionOxygen Delivery Method: Circle system utilized Preoxygenation: Pre-oxygenation with 100% oxygen Intubation Type: IV induction LMA: LMA inserted LMA Size: 4.0 Laser Tube: Cuffed inflated with minimal occlusive pressure - saline Number of attempts: 1 Placement Confirmation: positive ETCO2 and breath sounds checked- equal and bilateral Tube secured with: Tape Dental Injury: Teeth and Oropharynx as per pre-operative assessment

## 2016-11-15 NOTE — Brief Op Note (Signed)
11/15/2016  7:48 AM  PATIENT:  Emily Romero  50 y.o. female  PRE-OPERATIVE DIAGNOSIS:  menorrhagia  POST-OPERATIVE DIAGNOSIS:  MENORRHAGIA   PROCEDURE:  Procedure(s): NOVASURE ENDOMETRIAL  ABLATION (N/A)  SURGEON:  Surgeon(s) and Role:    * Aqua Denslow Bovard-Stuckert, MD - Primary  ANESTHESIA:   general by LMA  EBL:  Total I/O In: 800 [I.V.:800] Out: 105 [Urine:100; Blood:5]  BLOOD ADMINISTERED:none  DRAINS: none   LOCAL MEDICATIONS USED:  NONE  SPECIMEN:  No Specimen  DISPOSITION OF SPECIMEN:  N/A  COUNTS:  YES  TOURNIQUET:  * No tourniquets in log *  DICTATION: .Other Dictation: Dictation Number [id number]  PLAN OF CARE: Discharge to home after PACU  PATIENT DISPOSITION:  PACU - hemodynamically stable.   Delay start of Pharmacological VTE agent (>24hrs) due to surgical blood loss or risk of bleeding: not applicable

## 2016-11-15 NOTE — Transfer of Care (Signed)
Immediate Anesthesia Transfer of Care Note  Patient: Emily Romero  Procedure(s) Performed: Procedure(s): NOVASURE ENDOMETRIAL  ABLATION (N/A)  Patient Location: PACU  Anesthesia Type:General  Level of Consciousness: awake, alert  and oriented  Airway & Oxygen Therapy: Patient Spontanous Breathing and Patient connected to nasal cannula oxygen  Post-op Assessment: Report given to RN and Post -op Vital signs reviewed and stable  Post vital signs: Reviewed and stable  Last Vitals:  Vitals:   11/15/16 0559  BP: (!) 162/100  Pulse: 66  Resp: 16  Temp: 36.5 C    Last Pain:  Vitals:   11/15/16 0559  TempSrc: Oral         Complications: No apparent anesthesia complications

## 2016-11-15 NOTE — Discharge Instructions (Signed)
DISCHARGE INSTRUCTIONS: HYSTEROSCOPY / ENDOMETRIAL ABLATION The following instructions have been prepared to help you care for yourself upon your return home.  May Remove Scop patch on or before 11/18/16  May take Ibuprofen after 1:45pm today  May take stool softner while taking narcotic pain medication to prevent constipation.  Drink plenty of water.  Personal hygiene:  Use sanitary pads for vaginal drainage, not tampons.  Shower the day after your procedure.  NO tub baths, pools or Jacuzzis for 2-3 weeks.  Wipe front to back after using the bathroom.  Activity and limitations:  Do NOT drive or operate any equipment for 24 hours. The effects of anesthesia are still present and drowsiness may result.  Do NOT rest in bed all day.  Walking is encouraged.  Walk up and down stairs slowly.  You may resume your normal activity in one to two days or as indicated by your physician. Sexual activity: NO intercourse for at least 2 weeks after the procedure, or as indicated by your Doctor.  Diet: Eat a light meal as desired this evening. You may resume your usual diet tomorrow.  Return to Work: You may resume your work activities in one to two days or as indicated by Therapist, sportsyour Doctor.  What to expect after your surgery: Expect to have vaginal bleeding/discharge for 2-3 days and spotting for up to 10 days. It is not unusual to have soreness for up to 1-2 weeks. You may have a slight burning sensation when you urinate for the first day. Mild cramps may continue for a couple of days. You may have a regular period in 2-6 weeks.  Call your doctor for any of the following:  Excessive vaginal bleeding or clotting, saturating and changing one pad every hour.  Inability to urinate 6 hours after discharge from hospital.  Pain not relieved by pain medication.  Fever of 100.4 F or greater.  Unusual vaginal discharge or odor.  Return to office _________________Call for an appointment  ___________________ Patients signature: ______________________ Nurses signature ________________________  Post Anesthesia Care Unit [phone]

## 2016-11-15 NOTE — Interval H&P Note (Signed)
History and Physical Interval Note:  11/15/2016 7:04 AM  [name]  has presented today for surgery, with the diagnosis of menorrhagia  The various methods of treatment have been discussed with the patient and family. After consideration of risks, benefits and other options for treatment, the patient has consented to  Procedure(s):  NOVASURE ABLATION (N/A) as a surgical intervention .  The patient's history has been reviewed, patient examined, no change in status, stable for surgery.  I have reviewed the patient's chart and labs.  Questions were answered to the patient's satisfaction.     Bovard-Stuckert, Deverick Pruss

## 2016-11-16 ENCOUNTER — Encounter (HOSPITAL_COMMUNITY): Payer: Self-pay | Admitting: Obstetrics and Gynecology

## 2016-11-16 NOTE — Op Note (Signed)
NAME:  Emily Romero, Emily Romero                    ACCOUNT NO.:  MEDICAL RECORD NO.:  [id number]  LOCATION:                                 FACILITY:  PHYSICIAN:  Sherron MondayJody Bovard, MD             DATE OF BIRTH:  DATE OF PROCEDURE:  11/15/2016 DATE OF DISCHARGE:                              OPERATIVE REPORT   PREOPERATIVE DIAGNOSIS:  Menorrhagia with normal cavity by saline infused ultrasound and negative endometrial biopsy.  POSTOPERATIVE DIAGNOSIS:  Menorrhagia with normal cavity by saline infused ultrasound and negative endometrial biopsy, status post ablation.  PROCEDURE:  NovaSure endometrial ablation.  SURGEON:  Sherron MondayJody Bovard, MD.  ANESTHESIA:  General.  IV FLUIDS:  800 mL.  URINE OUTPUT:  100 mL.  ESTIMATED BLOOD LOSS:  Approximately 5 mL.  COMPLICATIONS:  None.  PATHOLOGY:  None.  DESCRIPTION OF PROCEDURE:  After informed consent was reviewed with the patient and her family, she was transported to the OR, placed on the table in supine position then placed in the [place]in stirrups, prepped and draped in the normal sterile fashion.  General anesthesia was induced by LMA and found to be adequate.  Using an open-sided speculum, her cervix was easily visualized.  The cavity length was measured.  The cervix was dilated to accommodate the NovaSure device.  In the process, the cervical length was obtained.  The cervical length was 10 giving the cavity length of 6.  The cavity width was calculated by the NovaSure device of approximately 2.7.  The cavity assessment was performed and was negative.  The ablation was performed for 1 minute 18 seconds.  The patient tolerated the procedure well.  Sponge, lap, and needle counts were correct x2 at the end of the procedure.  The ablation device was removed from her uterus.  The speculum and tenaculum were also removed. Hemostasis was assured.  The patient was awakened in stable condition.     Sherron MondayJody Bovard, MD     JB/MEDQ  D:  11/15/2016   T:  11/15/2016  Job:  [id number]

## 2017-06-01 ENCOUNTER — Other Ambulatory Visit: Payer: Self-pay | Admitting: Internal Medicine

## 2017-06-01 DIAGNOSIS — Z1231 Encounter for screening mammogram for malignant neoplasm of breast: Secondary | ICD-10-CM

## 2017-07-25 ENCOUNTER — Ambulatory Visit: Payer: Medicaid Other

## 2017-08-01 ENCOUNTER — Other Ambulatory Visit: Payer: Self-pay | Admitting: Internal Medicine

## 2017-08-01 DIAGNOSIS — N644 Mastodynia: Secondary | ICD-10-CM

## 2017-08-07 ENCOUNTER — Other Ambulatory Visit: Payer: Self-pay | Admitting: Internal Medicine

## 2017-08-07 ENCOUNTER — Ambulatory Visit
Admission: RE | Admit: 2017-08-07 | Discharge: 2017-08-07 | Disposition: A | Discharge status: Home / Self Care | Payer: Medicaid Other | Source: Ambulatory Visit | Attending: Internal Medicine | Admitting: Internal Medicine

## 2017-08-07 DIAGNOSIS — N6320 Unspecified lump in the left breast, unspecified quadrant: Secondary | ICD-10-CM

## 2017-08-07 DIAGNOSIS — N644 Mastodynia: Secondary | ICD-10-CM

## 2017-08-13 ENCOUNTER — Ambulatory Visit: Payer: Medicaid Other

## 2018-02-04 ENCOUNTER — Ambulatory Visit
Admission: RE | Admit: 2018-02-04 | Discharge: 2018-02-04 | Disposition: A | Discharge status: Home / Self Care | Payer: Medicaid Other | Source: Ambulatory Visit | Attending: Internal Medicine | Admitting: Internal Medicine

## 2018-02-04 ENCOUNTER — Ambulatory Visit: Payer: Medicaid Other

## 2018-02-04 DIAGNOSIS — N6320 Unspecified lump in the left breast, unspecified quadrant: Secondary | ICD-10-CM

## 2018-03-22 ENCOUNTER — Telehealth (HOSPITAL_COMMUNITY): Payer: Self-pay | Admitting: Internal Medicine

## 2018-03-26 ENCOUNTER — Other Ambulatory Visit (HOSPITAL_COMMUNITY): Payer: Self-pay | Admitting: Internal Medicine

## 2018-03-26 DIAGNOSIS — R0602 Shortness of breath: Secondary | ICD-10-CM

## 2018-03-26 NOTE — Telephone Encounter (Signed)
User: Trina Ao A Date/time: 03/25/18 10:14 AM  Comment: Called pt to sch echo but VM was full..RG  Context:  Outcome: No Answer/Busy  Phone number:  Phone Type:   Comm. type: Telephone Call type: Outgoing  Contact: Peter Garter Relation to patient: Self    User: Trina Ao A Date/time: 03/22/18 3:25 PM  Comment: Called pt and lmsg for her to CB to get sch for an echo.Edmonia Caprio  Context:  Outcome: Left Message  Phone number: [phone] Phone Type: Home Phone  Comm. type: Telephone Call type: Outgoing  Contact: Peter Garter Relation to patient: Self

## 2018-04-01 ENCOUNTER — Other Ambulatory Visit: Payer: Self-pay

## 2018-04-01 ENCOUNTER — Ambulatory Visit (HOSPITAL_COMMUNITY): Payer: Medicaid Other | Attending: Internal Medicine

## 2018-04-01 DIAGNOSIS — I119 Hypertensive heart disease without heart failure: Secondary | ICD-10-CM | POA: Insufficient documentation

## 2018-04-01 DIAGNOSIS — R0602 Shortness of breath: Secondary | ICD-10-CM | POA: Diagnosis present

## 2018-06-29 ENCOUNTER — Emergency Department (HOSPITAL_COMMUNITY)
Admission: EM | Admit: 2018-06-29 | Discharge: 2018-06-29 | Disposition: A | Discharge status: Home / Self Care | Payer: Medicaid Other | Attending: Emergency Medicine | Admitting: Emergency Medicine

## 2018-06-29 ENCOUNTER — Other Ambulatory Visit: Payer: Self-pay

## 2018-06-29 ENCOUNTER — Encounter (HOSPITAL_COMMUNITY): Payer: Self-pay | Admitting: Emergency Medicine

## 2018-06-29 ENCOUNTER — Emergency Department (HOSPITAL_COMMUNITY): Payer: Medicaid Other

## 2018-06-29 DIAGNOSIS — X500XXA Overexertion from strenuous movement or load, initial encounter: Secondary | ICD-10-CM | POA: Insufficient documentation

## 2018-06-29 DIAGNOSIS — M25562 Pain in left knee: Secondary | ICD-10-CM | POA: Diagnosis present

## 2018-06-29 DIAGNOSIS — Y92007 Garden or yard of unspecified non-institutional (private) residence as the place of occurrence of the external cause: Secondary | ICD-10-CM | POA: Diagnosis not present

## 2018-06-29 DIAGNOSIS — Y93H2 Activity, gardening and landscaping: Secondary | ICD-10-CM | POA: Diagnosis not present

## 2018-06-29 DIAGNOSIS — Z79899 Other long term (current) drug therapy: Secondary | ICD-10-CM | POA: Insufficient documentation

## 2018-06-29 DIAGNOSIS — I10 Essential (primary) hypertension: Secondary | ICD-10-CM | POA: Insufficient documentation

## 2018-06-29 DIAGNOSIS — F17210 Nicotine dependence, cigarettes, uncomplicated: Secondary | ICD-10-CM | POA: Insufficient documentation

## 2018-06-29 DIAGNOSIS — Y999 Unspecified external cause status: Secondary | ICD-10-CM | POA: Insufficient documentation

## 2018-06-29 MED ORDER — NAPROXEN 250 MG PO TABS
500.0000 mg | ORAL_TABLET | Freq: Once | ORAL | Status: AC
  Administered 2018-06-29: 500 mg via ORAL
  Filled 2018-06-29: qty 2

## 2018-06-29 MED ORDER — NAPROXEN 500 MG PO TABS
500.0000 mg | ORAL_TABLET | Freq: Two times a day (BID) | ORAL | 0 refills | Status: DC
Start: 2018-06-29 — End: 2020-03-08

## 2018-06-29 NOTE — ED Triage Notes (Signed)
Pt presents with L knee pain that began Friday; pt states was outside in her yard all day thurs doing yardwork, never noticed the pain; pushed through the pain Friday and pain worse today; pulses present distally; ROM decreased; pt ambulatory from waiting
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
## 2018-06-29 NOTE — Discharge Instructions (Addendum)
Please read and follow all provided instructions.  You have been seen today for pain in your left knee  Tests performed today include: An x-ray of the affected area - does NOT show any broken bones or dislocations.  Vital signs. See below for your results today.   Home care instructions: -- *PRICE in the first 24-48 hours after injury: Protect (with brace, splint, sling), if given by your provider Rest Ice- Do not apply ice pack directly to your skin, place towel or similar between your skin and ice/ice pack. Apply ice for 20 min, then remove for 40 min while awake Compression- Wear brace, elastic bandage, splint as directed by your provider Elevate affected extremity above the level of your heart when not walking around for the first 24-48 hours   Medications:  - Naproxen is a nonsteroidal anti-inflammatory medication that will help with pain and swelling. Be sure to take this medication as prescribed with food, 1 pill every 12 hours,  It should be taken with food, as it can cause stomach upset, and more seriously, stomach bleeding. Do not take other nonsteroidal anti-inflammatory medications with this such as Advil, Motrin, Aleve, Mobic, Goodie Powder, or Motrin.    You make take Tylenol per over the counter dosing with these medications.   We have prescribed you new medication(s) today. Discuss the medications prescribed today with your pharmacist as they can have adverse effects and interactions with your other medicines including over the counter and prescribed medications. Seek medical evaluation if you start to experience new or abnormal symptoms after taking one of these medicines, seek care immediately if you start to experience difficulty breathing, feeling of your throat closing, facial swelling, or rash as these could be indications of a more serious allergic reaction  Follow-up instructions: Please follow-up with your primary care provider or the provided orthopedic physician  (bone specialist) if you continue to have significant pain in 1 week. In this case you may have a more severe injury that requires further care.   Return instructions:  Please return if your digits or extremity are numb or tingling, appear gray or blue, or you have severe pain (also elevate the extremity and loosen splint or wrap if you were given one) Please return if you have redness or fevers.  Please return to the Emergency Department if you experience worsening symptoms.  Please return if you have any other emergent concerns. Additional Information:  Your vital signs today were: BP (!) 130/109    Pulse 75    Temp 97.7 F (36.5 C) (Oral)    Resp 18    Ht 5\' 7"  (1.702 m)    Wt 89.8 kg    SpO2 95%    BMI 31.01 kg/m  If your blood pressure (BP) was elevated above 135/85 this visit, please have this repeated by your doctor within one month. ---------------

## 2018-06-29 NOTE — ED Notes (Signed)
Pt given ice pack for left knee.  

## 2018-06-29 NOTE — ED Notes (Signed)
Patient transported to X-ray 

## 2018-10-02 ENCOUNTER — Other Ambulatory Visit: Payer: Self-pay | Admitting: Internal Medicine

## 2018-10-02 DIAGNOSIS — N63 Unspecified lump in unspecified breast: Secondary | ICD-10-CM

## 2018-10-23 ENCOUNTER — Ambulatory Visit
Admission: RE | Admit: 2018-10-23 | Discharge: 2018-10-23 | Disposition: A | Discharge status: Home / Self Care | Payer: Medicaid Other | Source: Ambulatory Visit | Attending: Internal Medicine | Admitting: Internal Medicine

## 2018-10-23 DIAGNOSIS — N63 Unspecified lump in unspecified breast: Secondary | ICD-10-CM

## 2020-02-13 ENCOUNTER — Other Ambulatory Visit: Payer: Self-pay | Admitting: Internal Medicine

## 2020-02-13 DIAGNOSIS — N6320 Unspecified lump in the left breast, unspecified quadrant: Secondary | ICD-10-CM

## 2020-02-16 ENCOUNTER — Other Ambulatory Visit: Payer: Self-pay | Admitting: Podiatry

## 2020-02-16 ENCOUNTER — Ambulatory Visit (INDEPENDENT_AMBULATORY_CARE_PROVIDER_SITE_OTHER): Payer: Medicaid Other

## 2020-02-16 ENCOUNTER — Other Ambulatory Visit: Payer: Self-pay

## 2020-02-16 ENCOUNTER — Ambulatory Visit: Payer: Medicaid Other | Admitting: Podiatry

## 2020-02-16 DIAGNOSIS — M722 Plantar fascial fibromatosis: Secondary | ICD-10-CM | POA: Diagnosis not present

## 2020-02-16 DIAGNOSIS — M79671 Pain in right foot: Secondary | ICD-10-CM

## 2020-02-17 ENCOUNTER — Encounter: Payer: Self-pay | Admitting: Podiatry
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
## 2020-02-27 ENCOUNTER — Telehealth: Payer: Self-pay | Admitting: *Deleted

## 2020-02-27 NOTE — Telephone Encounter (Signed)
Pt called states she was seen by Dr. Allena Katz about 2 weeks ago Monday and given a shot for plantar fasciitis that helped to about late evening and now she is having pain not only in the heel but on top of the foot near the toes, may have been waling to compensate. I told pt that often treatment for plantar fasciitis is built upon previous treatments whether improvement or not and that she may also have another problem from walking different, so if felt she would benefit from an appt. I told pt if she was able to tolerate over the counter Ibuprofen as the package instructs and ice 3-4 times a day for 15-20 minutes/session, put the ice pack on the floor and leave sock on it would ice the heel and toe problem at the same times. I transferred to scheduler.

## 2020-03-01 ENCOUNTER — Other Ambulatory Visit: Payer: Self-pay

## 2020-03-01 ENCOUNTER — Encounter: Payer: Self-pay | Admitting: Podiatry

## 2020-03-01 ENCOUNTER — Ambulatory Visit: Payer: Medicaid Other | Admitting: Podiatry

## 2020-03-01 VITALS — Temp 98.0°F

## 2020-03-01 DIAGNOSIS — M216X1 Other acquired deformities of right foot: Secondary | ICD-10-CM

## 2020-03-01 DIAGNOSIS — M79671 Pain in right foot: Secondary | ICD-10-CM

## 2020-03-01 DIAGNOSIS — M722 Plantar fascial fibromatosis: Secondary | ICD-10-CM | POA: Diagnosis not present

## 2020-03-01 DIAGNOSIS — M7731 Calcaneal spur, right foot: Secondary | ICD-10-CM

## 2020-03-02 ENCOUNTER — Encounter: Payer: Self-pay | Admitting: Podiatry
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
## 2020-03-08 ENCOUNTER — Encounter (HOSPITAL_COMMUNITY): Payer: Self-pay | Admitting: *Deleted

## 2020-03-08 ENCOUNTER — Emergency Department (HOSPITAL_COMMUNITY): Payer: Medicaid Other

## 2020-03-08 ENCOUNTER — Inpatient Hospital Stay (HOSPITAL_COMMUNITY)
Admission: EM | Admit: 2020-03-08 | Discharge: 2020-03-11 | DRG: 684 | Disposition: A | Discharge status: Home / Self Care | Payer: Medicaid Other | Source: Ambulatory Visit | Attending: Internal Medicine | Admitting: Internal Medicine

## 2020-03-08 ENCOUNTER — Other Ambulatory Visit: Payer: Self-pay

## 2020-03-08 DIAGNOSIS — I10 Essential (primary) hypertension: Secondary | ICD-10-CM | POA: Diagnosis present

## 2020-03-08 DIAGNOSIS — E860 Dehydration: Secondary | ICD-10-CM | POA: Diagnosis present

## 2020-03-08 DIAGNOSIS — I959 Hypotension, unspecified: Secondary | ICD-10-CM | POA: Diagnosis present

## 2020-03-08 DIAGNOSIS — E669 Obesity, unspecified: Secondary | ICD-10-CM | POA: Diagnosis present

## 2020-03-08 DIAGNOSIS — F329 Major depressive disorder, single episode, unspecified: Secondary | ICD-10-CM | POA: Diagnosis present

## 2020-03-08 DIAGNOSIS — Z79899 Other long term (current) drug therapy: Secondary | ICD-10-CM

## 2020-03-08 DIAGNOSIS — G43909 Migraine, unspecified, not intractable, without status migrainosus: Secondary | ICD-10-CM | POA: Diagnosis present

## 2020-03-08 DIAGNOSIS — A084 Viral intestinal infection, unspecified: Secondary | ICD-10-CM | POA: Diagnosis present

## 2020-03-08 DIAGNOSIS — R197 Diarrhea, unspecified: Secondary | ICD-10-CM

## 2020-03-08 DIAGNOSIS — M47816 Spondylosis without myelopathy or radiculopathy, lumbar region: Secondary | ICD-10-CM | POA: Diagnosis present

## 2020-03-08 DIAGNOSIS — R112 Nausea with vomiting, unspecified: Secondary | ICD-10-CM

## 2020-03-08 DIAGNOSIS — K219 Gastro-esophageal reflux disease without esophagitis: Secondary | ICD-10-CM | POA: Diagnosis present

## 2020-03-08 DIAGNOSIS — Z803 Family history of malignant neoplasm of breast: Secondary | ICD-10-CM

## 2020-03-08 DIAGNOSIS — N179 Acute kidney failure, unspecified: Principal | ICD-10-CM | POA: Diagnosis present

## 2020-03-08 DIAGNOSIS — Z20822 Contact with and (suspected) exposure to covid-19: Secondary | ICD-10-CM | POA: Diagnosis present

## 2020-03-08 DIAGNOSIS — Z87442 Personal history of urinary calculi: Secondary | ICD-10-CM

## 2020-03-08 DIAGNOSIS — E785 Hyperlipidemia, unspecified: Secondary | ICD-10-CM | POA: Diagnosis present

## 2020-03-08 DIAGNOSIS — Z98891 History of uterine scar from previous surgery: Secondary | ICD-10-CM

## 2020-03-08 DIAGNOSIS — R1110 Vomiting, unspecified: Secondary | ICD-10-CM

## 2020-03-08 DIAGNOSIS — E875 Hyperkalemia: Secondary | ICD-10-CM | POA: Diagnosis present

## 2020-03-08 DIAGNOSIS — F17210 Nicotine dependence, cigarettes, uncomplicated: Secondary | ICD-10-CM | POA: Diagnosis present

## 2020-03-08 LAB — CBC
HCT: 43.1 % (ref 36.0–46.0)
Hemoglobin: 14.4 g/dL (ref 12.0–15.0)
MCH: 31.3 pg (ref 26.0–34.0)
MCHC: 33.4 g/dL (ref 30.0–36.0)
MCV: 93.7 fL (ref 80.0–100.0)
Platelets: 180 10*3/uL (ref 150–400)
RBC: 4.6 MIL/uL (ref 3.87–5.11)
RDW: 13 % (ref 11.5–15.5)
WBC: 8.4 10*3/uL (ref 4.0–10.5)
nRBC: 0 % (ref 0.0–0.2)

## 2020-03-08 LAB — COMPREHENSIVE METABOLIC PANEL
ALT: 33 U/L (ref 0–44)
AST: 41 U/L (ref 15–41)
Albumin: 2.9 g/dL — ABNORMAL LOW (ref 3.5–5.0)
Alkaline Phosphatase: 93 U/L (ref 38–126)
Anion gap: 14 (ref 5–15)
BUN: 81 mg/dL — ABNORMAL HIGH (ref 6–20)
CO2: 27 mmol/L (ref 22–32)
Calcium: 8.8 mg/dL — ABNORMAL LOW (ref 8.9–10.3)
Chloride: 94 mmol/L — ABNORMAL LOW (ref 98–111)
Creatinine, Ser: 4.98 mg/dL — ABNORMAL HIGH (ref 0.44–1.00)
GFR calc Af Amer: 11 mL/min — ABNORMAL LOW (ref >60)
GFR calc non Af Amer: 9 mL/min — ABNORMAL LOW (ref >60)
Glucose, Bld: 105 mg/dL — ABNORMAL HIGH (ref 70–99)
Potassium: 3.6 mmol/L (ref 3.5–5.1)
Sodium: 135 mmol/L (ref 135–145)
Total Bilirubin: 0.7 mg/dL (ref 0.3–1.2)
Total Protein: 7.4 g/dL (ref 6.5–8.1)

## 2020-03-08 LAB — LIPASE, BLOOD: Lipase: 22 U/L (ref 11–51)

## 2020-03-08 MED ORDER — LACTATED RINGERS IV BOLUS
2000.0000 mL | Freq: Once | INTRAVENOUS | Status: AC
  Administered 2020-03-09: 2000 mL via INTRAVENOUS

## 2020-03-08 MED ORDER — SODIUM CHLORIDE 0.9% FLUSH
3.0000 mL | Freq: Once | INTRAVENOUS | Status: AC
  Administered 2020-03-09: 3 mL via INTRAVENOUS

## 2020-03-08 NOTE — ED Triage Notes (Addendum)
Pt arrived by [facility] from dr office. Having n/v since Friday with abd pain. Began having diarrhea yesterday. Was sent here due to hypotension (72/42) at pcp office and abnormal lab work. Chest xray, covid and flu all negative. Given bolus pta and bp 105/61.WBC 22

## 2020-03-09 DIAGNOSIS — E785 Hyperlipidemia, unspecified: Secondary | ICD-10-CM | POA: Diagnosis present

## 2020-03-09 DIAGNOSIS — F17210 Nicotine dependence, cigarettes, uncomplicated: Secondary | ICD-10-CM | POA: Diagnosis present

## 2020-03-09 DIAGNOSIS — Z20822 Contact with and (suspected) exposure to covid-19: Secondary | ICD-10-CM | POA: Diagnosis present

## 2020-03-09 DIAGNOSIS — M47816 Spondylosis without myelopathy or radiculopathy, lumbar region: Secondary | ICD-10-CM | POA: Diagnosis present

## 2020-03-09 DIAGNOSIS — I10 Essential (primary) hypertension: Secondary | ICD-10-CM

## 2020-03-09 DIAGNOSIS — F329 Major depressive disorder, single episode, unspecified: Secondary | ICD-10-CM

## 2020-03-09 DIAGNOSIS — E669 Obesity, unspecified: Secondary | ICD-10-CM | POA: Diagnosis present

## 2020-03-09 DIAGNOSIS — Z803 Family history of malignant neoplasm of breast: Secondary | ICD-10-CM | POA: Diagnosis not present

## 2020-03-09 DIAGNOSIS — I959 Hypotension, unspecified: Secondary | ICD-10-CM | POA: Diagnosis present

## 2020-03-09 DIAGNOSIS — G43909 Migraine, unspecified, not intractable, without status migrainosus: Secondary | ICD-10-CM | POA: Diagnosis present

## 2020-03-09 DIAGNOSIS — R1110 Vomiting, unspecified: Secondary | ICD-10-CM

## 2020-03-09 DIAGNOSIS — Z98891 History of uterine scar from previous surgery: Secondary | ICD-10-CM | POA: Diagnosis not present

## 2020-03-09 DIAGNOSIS — R112 Nausea with vomiting, unspecified: Secondary | ICD-10-CM | POA: Diagnosis present

## 2020-03-09 DIAGNOSIS — E860 Dehydration: Secondary | ICD-10-CM | POA: Diagnosis present

## 2020-03-09 DIAGNOSIS — E875 Hyperkalemia: Secondary | ICD-10-CM | POA: Diagnosis present

## 2020-03-09 DIAGNOSIS — N179 Acute kidney failure, unspecified: Principal | ICD-10-CM

## 2020-03-09 DIAGNOSIS — Z79899 Other long term (current) drug therapy: Secondary | ICD-10-CM | POA: Diagnosis not present

## 2020-03-09 DIAGNOSIS — K219 Gastro-esophageal reflux disease without esophagitis: Secondary | ICD-10-CM

## 2020-03-09 DIAGNOSIS — A084 Viral intestinal infection, unspecified: Secondary | ICD-10-CM | POA: Diagnosis present

## 2020-03-09 DIAGNOSIS — Z87442 Personal history of urinary calculi: Secondary | ICD-10-CM | POA: Diagnosis not present

## 2020-03-09 LAB — LACTIC ACID, PLASMA: Lactic Acid, Venous: 0.9 mmol/L (ref 0.5–1.9)

## 2020-03-09 LAB — GASTROINTESTINAL PANEL BY PCR, STOOL (REPLACES STOOL CULTURE)

## 2020-03-09 LAB — BASIC METABOLIC PANEL
Anion gap: 12 (ref 5–15)
BUN: 82 mg/dL — ABNORMAL HIGH (ref 6–20)
CO2: 24 mmol/L (ref 22–32)
Calcium: 8.5 mg/dL — ABNORMAL LOW (ref 8.9–10.3)
Chloride: 100 mmol/L (ref 98–111)
Creatinine, Ser: 3.97 mg/dL — ABNORMAL HIGH (ref 0.44–1.00)
GFR calc Af Amer: 14 mL/min — ABNORMAL LOW (ref >60)
GFR calc non Af Amer: 12 mL/min — ABNORMAL LOW (ref >60)
Glucose, Bld: 146 mg/dL — ABNORMAL HIGH (ref 70–99)
Potassium: 3.4 mmol/L — ABNORMAL LOW (ref 3.5–5.1)
Sodium: 136 mmol/L (ref 135–145)

## 2020-03-09 LAB — URINALYSIS, ROUTINE W REFLEX MICROSCOPIC
Bilirubin Urine: NEGATIVE
Glucose, UA: NEGATIVE mg/dL
Hgb urine dipstick: NEGATIVE
Ketones, ur: NEGATIVE mg/dL
Nitrite: NEGATIVE
Protein, ur: NEGATIVE mg/dL
Specific Gravity, Urine: 1.015 (ref 1.005–1.030)
pH: 5 (ref 5.0–8.0)

## 2020-03-09 LAB — MAGNESIUM: Magnesium: 2.4 mg/dL (ref 1.7–2.4)

## 2020-03-09 LAB — C DIFFICILE QUICK SCREEN W PCR REFLEX
C Diff antigen: NEGATIVE
C Diff interpretation: NOT DETECTED
C Diff toxin: NEGATIVE

## 2020-03-09 LAB — PHOSPHORUS: Phosphorus: 5.9 mg/dL — ABNORMAL HIGH (ref 2.5–4.6)

## 2020-03-09 LAB — RESPIRATORY PANEL BY RT PCR (FLU A&B, COVID)
Influenza A by PCR: NEGATIVE
Influenza B by PCR: NEGATIVE
SARS Coronavirus 2 by RT PCR: NEGATIVE

## 2020-03-09 LAB — CK: Total CK: 771 U/L — ABNORMAL HIGH (ref 38–234)

## 2020-03-09 LAB — HIV ANTIBODY (ROUTINE TESTING W REFLEX): HIV Screen 4th Generation wRfx: NONREACTIVE

## 2020-03-09 MED ORDER — POTASSIUM CHLORIDE CRYS ER 20 MEQ PO TBCR
40.0000 meq | EXTENDED_RELEASE_TABLET | Freq: Once | ORAL | Status: AC
  Administered 2020-03-09: 09:00:00 40 meq via ORAL
  Filled 2020-03-09: qty 2

## 2020-03-09 MED ORDER — ACETAMINOPHEN 325 MG PO TABS
650.0000 mg | ORAL_TABLET | Freq: Once | ORAL | Status: AC
  Administered 2020-03-09: 650 mg via ORAL
  Filled 2020-03-09: qty 2

## 2020-03-09 MED ORDER — POTASSIUM CHLORIDE 2 MEQ/ML IV SOLN
INTRAVENOUS | Status: AC
  Filled 2020-03-09 (×3): qty 1000

## 2020-03-09 MED ORDER — ONDANSETRON HCL 4 MG/2ML IJ SOLN: 4.0000 mg | Freq: Four times a day (QID) | INTRAMUSCULAR | Status: DC | PRN

## 2020-03-09 MED ORDER — LACTATED RINGERS IV SOLN: INTRAVENOUS | Status: AC

## 2020-03-09 MED ORDER — PANTOPRAZOLE SODIUM 40 MG PO TBEC
40.0000 mg | DELAYED_RELEASE_TABLET | Freq: Every day | ORAL | Status: DC
  Administered 2020-03-09 – 2020-03-11 (×3): 40 mg via ORAL
  Filled 2020-03-09 (×3): qty 1

## 2020-03-09 MED ORDER — ESCITALOPRAM OXALATE 10 MG PO TABS
5.0000 mg | ORAL_TABLET | Freq: Every day | ORAL | Status: DC
  Administered 2020-03-09 – 2020-03-11 (×3): 5 mg via ORAL
  Filled 2020-03-09 (×3): qty 1

## 2020-03-09 MED ORDER — PANTOPRAZOLE SODIUM 40 MG IV SOLR
40.0000 mg | INTRAVENOUS | Status: DC
  Administered 2020-03-09: 40 mg via INTRAVENOUS
  Filled 2020-03-09: qty 40

## 2020-03-09 MED ORDER — ROSUVASTATIN CALCIUM 5 MG PO TABS
10.0000 mg | ORAL_TABLET | Freq: Every day | ORAL | Status: DC
  Administered 2020-03-09 – 2020-03-11 (×3): 10 mg via ORAL
  Filled 2020-03-09 (×3): qty 2

## 2020-03-09 MED ORDER — HEPARIN SODIUM (PORCINE) 5000 UNIT/ML IJ SOLN
5000.0000 [IU] | Freq: Three times a day (TID) | INTRAMUSCULAR | Status: DC
  Administered 2020-03-09 – 2020-03-11 (×8): 5000 [IU] via SUBCUTANEOUS
  Filled 2020-03-09 (×7): qty 1

## 2020-03-09 MED ORDER — LACTATED RINGERS IV SOLN: INTRAVENOUS | Status: DC

## 2020-03-09 MED ORDER — METOPROLOL SUCCINATE ER 25 MG PO TB24
25.0000 mg | ORAL_TABLET | Freq: Every day | ORAL | Status: DC
  Administered 2020-03-10 – 2020-03-11 (×2): 25 mg via ORAL
  Filled 2020-03-09 (×2): qty 1

## 2020-03-09 MED ORDER — ACETAMINOPHEN 325 MG PO TABS
650.0000 mg | ORAL_TABLET | Freq: Four times a day (QID) | ORAL | Status: DC | PRN
  Administered 2020-03-10 – 2020-03-11 (×2): 650 mg via ORAL
  Filled 2020-03-09 (×2): qty 2

## 2020-03-09 NOTE — Progress Notes (Signed)
Emily Romero, is a 54 y.o. female, DOB - [birth year], SVX:[id number]  Patient admitted few hours ago for gastroenteritis predominantly nausea and vomiting, one loose stool, doubt this is C. difficile, she is visibly dehydrated, hypotensive with AKI and hyperkalemia.  IV fluid bolus and maintenance ordered, potassium replaced.  GI pathogen panel is pending.  She appears nontoxic.  Continue to monitor closely.  Likely viral gastroenteritis.  Continue supportive care.   Vitals:   03/09/20 0609 03/09/20 0734 03/09/20 0830 03/09/20 0952  BP: 106/68 (!) 87/73 102/84 98/62  Pulse: 70 (!) 25 99 71  Resp:  12 19 18   Temp:      TempSrc:      SpO2: 98% 94% 98% 98%        Data Review   Micro Results Recent Results (from the past 240 hour(s))  Respiratory Panel by RT PCR (Flu A&B, Covid) - Nasopharyngeal Swab     Status: None   Collection Time: 03/09/20 12:26 AM   Specimen: Nasopharyngeal Swab  Result Value Ref Range Status   SARS Coronavirus 2 by RT PCR NEGATIVE NEGATIVE Final    Comment: (NOTE) SARS-CoV-2 target nucleic acids are NOT DETECTED. The SARS-CoV-2 RNA is generally detectable in upper respiratoy specimens during the acute phase of infection. The lowest concentration of SARS-CoV-2 viral copies this assay can detect is 131 copies/mL. A negative result does not preclude SARS-Cov-2 infection and should not be used as the sole basis for treatment or other patient management decisions. A negative result may occur with  improper specimen collection/handling, submission of specimen other than nasopharyngeal swab, presence of viral mutation(s) within the areas targeted by this assay, and inadequate number of viral copies (<131 copies/mL). A negative result must be combined with clinical observations, patient history, and epidemiological information. The expected result is Negative. Fact Sheet for  Patients:  PinkCheek.be Fact Sheet for Healthcare Providers:  GravelBags.it This test is not yet ap proved or cleared by the [place] FDA and  has been authorized for detection and/or diagnosis of SARS-CoV-2 by FDA under an Emergency Use Authorization (EUA). This EUA will remain  in effect (meaning this test can be used) for the duration of the COVID-19 declaration under Section 564(b)(1) of the Act, 21 U.S.C. section 360bbb-3(b)(1), unless the authorization is terminated or revoked sooner.    Influenza A by PCR NEGATIVE NEGATIVE Final   Influenza B by PCR NEGATIVE NEGATIVE Final    Comment: (NOTE) The Xpert Xpress SARS-CoV-2/FLU/RSV assay is intended as an aid in  the diagnosis of influenza from Nasopharyngeal swab specimens and  should not be used as a sole basis for treatment. Nasal washings and  aspirates are unacceptable for Xpert Xpress SARS-CoV-2/FLU/RSV  testing. Fact Sheet for Patients: PinkCheek.be Fact Sheet for Healthcare Providers: GravelBags.it This test is not yet approved or cleared by the [place] FDA and  has been authorized for detection and/or diagnosis of SARS-CoV-2 by  FDA under an Emergency Use Authorization (EUA). This EUA will remain  in effect (meaning this test can be used) for the duration of the  Covid-19 declaration under Section 564(b)(1) of the Act, 21  U.S.C. section 360bbb-3(b)(1), unless the authorization is  terminated or revoked. Performed at [facility], [place] [address] [place], [place] [address]   C Difficile Quick Screen w PCR reflex     Status: None   Collection Time: 03/09/20  7:35 AM   Specimen: STOOL  Result Value Ref Range Status   C  Diff antigen NEGATIVE NEGATIVE Final   C Diff toxin NEGATIVE NEGATIVE Final   C Diff interpretation No C. difficile detected.  Final    Radiology Reports DG Chest Port 1  View  Result Date: 03/08/2020 CLINICAL DATA:  Cough EXAM: PORTABLE CHEST 1 VIEW COMPARISON:  09/01/2008 FINDINGS: The heart size and mediastinal contours are within normal limits. Both lungs are clear. The visualized skeletal structures are unremarkable. IMPRESSION: Normal study. Electronically Signed   By: Charlett Nose M.D.   On: 03/08/2020 23:48   DG Foot Complete Right  Result Date: 02/16/2020 Please see detailed radiograph report in office note.   CBC Recent Labs  Lab 03/08/20 1623  WBC 8.4  HGB 14.4  HCT 43.1  PLT 180  MCV 93.7  MCH 31.3  MCHC 33.4  RDW 13.0    Chemistries  Recent Labs  Lab 03/08/20 1623 03/09/20 0248 03/09/20 0420  NA 135  --  136  K 3.6  --  3.4*  CL 94*  --  100  CO2 27  --  24  GLUCOSE 105*  --  146*  BUN 81*  --  82*  CREATININE 4.98*  --  3.97*  CALCIUM 8.8*  --  8.5*  MG  --  2.4  --   AST 41  --   --   ALT 33  --   --   ALKPHOS 93  --   --   BILITOT 0.7  --   --    ------------------------------------------------------------------------------------------------------------------ CrCl cannot be calculated (Unknown ideal weight.). ------------------------------------------------------------------------------------------------------------------ No results for input(s): HGBA1C in the last 72 hours. ------------------------------------------------------------------------------------------------------------------ No results for input(s): CHOL, HDL, LDLCALC, TRIG, CHOLHDL, LDLDIRECT in the last 72 hours. ------------------------------------------------------------------------------------------------------------------ No results for input(s): TSH, T4TOTAL, T3FREE, THYROIDAB in the last 72 hours.  Invalid input(s): FREET3 ------------------------------------------------------------------------------------------------------------------ No results for input(s): VITAMINB12, FOLATE, FERRITIN, TIBC, IRON, RETICCTPCT in the last 72  hours.  Coagulation profile No results for input(s): INR, PROTIME in the last 168 hours.  No results for input(s): DDIMER in the last 72 hours.  Cardiac Enzymes No results for input(s): CKMB, TROPONINI, MYOGLOBIN in the last 168 hours.  Invalid input(s): CK ------------------------------------------------------------------------------------------------------------------ Invalid input(s): POCBNP   Signature  Susa Raring M.D on 03/09/2020 at 11:44 AM   -  To page go to [email or web address]      ]
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
## 2020-03-09 NOTE — ED Notes (Signed)
Bladder scan 140 mL.

## 2020-03-09 NOTE — ED Notes (Signed)
Lunch Tray Ordered @ 1032. 

## 2020-03-09 NOTE — ED Notes (Signed)
Informed pt of need for stool sample. Verbalized understanding.

## 2020-03-10 DIAGNOSIS — I10 Essential (primary) hypertension: Secondary | ICD-10-CM

## 2020-03-10 LAB — GLUCOSE, CAPILLARY: Glucose-Capillary: 87 mg/dL (ref 70–99)

## 2020-03-10 LAB — BASIC METABOLIC PANEL
Anion gap: 9 (ref 5–15)
BUN: 34 mg/dL — ABNORMAL HIGH (ref 6–20)
CO2: 22 mmol/L (ref 22–32)
Calcium: 8.9 mg/dL (ref 8.9–10.3)
Chloride: 108 mmol/L (ref 98–111)
Creatinine, Ser: 1.44 mg/dL — ABNORMAL HIGH (ref 0.44–1.00)
GFR calc Af Amer: 49 mL/min — ABNORMAL LOW (ref >60)
GFR calc non Af Amer: 42 mL/min — ABNORMAL LOW (ref >60)
Glucose, Bld: 136 mg/dL — ABNORMAL HIGH (ref 70–99)
Potassium: 5.2 mmol/L — ABNORMAL HIGH (ref 3.5–5.1)
Sodium: 139 mmol/L (ref 135–145)

## 2020-03-10 LAB — CBC
HCT: 39.2 % (ref 36.0–46.0)
Hemoglobin: 12.6 g/dL (ref 12.0–15.0)
MCH: 30.6 pg (ref 26.0–34.0)
MCHC: 32.1 g/dL (ref 30.0–36.0)
MCV: 95.1 fL (ref 80.0–100.0)
Platelets: 218 10*3/uL (ref 150–400)
RBC: 4.12 MIL/uL (ref 3.87–5.11)
RDW: 13.2 % (ref 11.5–15.5)
WBC: 6 10*3/uL (ref 4.0–10.5)
nRBC: 0 % (ref 0.0–0.2)

## 2020-03-10 LAB — MAGNESIUM: Magnesium: 2 mg/dL (ref 1.7–2.4)

## 2020-03-10 MED ORDER — SODIUM CHLORIDE 0.9 % IV SOLN: INTRAVENOUS | Status: DC

## 2020-03-10 NOTE — Progress Notes (Signed)
PROGRESS NOTE                                                                                                                                                                                                             Patient Demographics:    Emily Romero, is a 54 y.o. female, DOB - July 21, 1966, ZHY:[id number]  Admit date - 03/08/2020   Admitting Physician Joselyn Arrow, MD  Outpatient Primary MD for the patient is Avva, Joylene Draft, MD  LOS - 1   Chief Complaint  Patient presents with  . Emesis  . Abdominal Pain       Brief Narrative     HPI: Emily Romero is a 54 y.o. female with PMH of HTN, HLD, obesity and MDD who presented to ED with one week of vomiting and admitted for AKI.  Patient reports that she began vomiting one week ago and noted a fever to 101F at that time. She has been exhausted and sleeping much of the day since that time. Saw PCP on Friday and was prescribed Levaquin. Diarrhea started this weekend and patient continued to be unable to tolerate PO and continued vomiting. Febrile to 102F at PCP office on Friday. Reports a headache that is improving now in ED and dizziness with walking. Reports vague abdominal pain in lower quadrants that has not radiated and overall not bothering her much. She has not taken any of her home meds as she has vomited up everything by mouth. She had one episode of emesis with small amounts of blood. She returned to PCP's office today and was found to have low BP and sent to ER. Denies other complaints. Denies cough, SOB, chest pain, constipation, dysuria, hematuria, hematochezia, melena, difficulty moving arms/legs, speech difficulty, confusion or any other complaints.  In the ED: Mild hypotension otherwise vitals stable on room air. Labs remarkable for: Cr 4.98 and CK 771. Lipase and CBC WNL. GI and C diff panel ordered. UA ordered. Respiratory panel in process.  CXR: non-acute Patient was given  Tylenol and IVF and called for admission for AKI.   Subjective:    Emily Romero today ports improved appetite, no nausea or vomiting, diarrhea is more formed currently .   Assessment  & Plan :    Principal Problem:   AKI (acute kidney injury) (HCC) Active Problems:  MDD (major depressive disorder)   Essential hypertension   Dyslipidemia   Vomiting   Dehydration   GERD (gastroesophageal reflux disease)   AKI secondary to nausea and vomiting with diarrhea -Patient significantly elevated creatinine 4.9 on admission, this is prerenal, secondary to her nausea and vomiting and diarrhea. -This has been improving with IV fluids, it is 1.44 today, potassium is 5.2, will go ahead and change her fluid to NS at 75 cc/h with no potassium. -Nausea and vomiting are improving, this is most likely due to viral gastroenteritis, C. difficile and GI panel is negative, tolerating her current diet so far/ -White blood cell within normal limit, no indication for antibiotics.  HTN -Pressure is acceptable, but remains on the lower side hold Telmisartan, Toprol-XL and Norvasc in setting of AKI and hypotension  HLD - hold statin as patient currently not tolerating PO  MDD - Hold Wellbutrin and Lexapro as patient currently not tolerating PO and has not taken for last week; restart PRN. Usually Wellbutrin causes withdrawal symptoms when not weaned but patient has already not taken for one week   GERD - IV PPI  COVID-19 Labs  No results for input(s): DDIMER, FERRITIN, LDH, CRP in the last 72 hours.  Lab Results  Component Value Date   SARSCOV2NAA NEGATIVE 03/09/2020     Code Status : Full  Family Communication  : D/W patient  Disposition Plan  :  Status is: Inpatient  Remains inpatient appropriate because: remains on IV fluis   Dispo: The patient is from: Home              Anticipated d/c is to: Home              Anticipated d/c date is: 1 day              Patient currently is  not medically stable to d/c.  Consults  :  None  Procedures  : None  DVT Prophylaxis  :  [place] heparin   Lab Results  Component Value Date   PLT 218 03/10/2020    Antibiotics  :    Anti-infectives (From admission, onward)   None        Objective:   Vitals:   03/09/20 1545 03/09/20 2147 03/10/20 0549 03/10/20 1222  BP: 108/74 114/79 (!) 146/74 125/77  Pulse: 83 87 79 72  Resp: 18 16 18 16   Temp: 98.2 F (36.8 C) 98.1 F (36.7 C) 98.1 F (36.7 C) (!) 97.5 F (36.4 C)  TempSrc: Oral Oral Oral Oral  SpO2: 98% 98% 97% 100%    Wt Readings from Last 3 Encounters:  06/29/18 89.8 kg  11/06/16 87.2 kg     Intake/Output Summary (Last 24 hours) at 03/10/2020 1508 Last data filed at 03/10/2020 0600 Gross per 24 hour  Intake 1868.76 ml  Output 1000 ml  Net 868.76 ml     Physical Exam  Awake Alert, Oriented X 3, No new F.N deficits, Normal affect Symmetrical Chest wall movement, Good air movement bilaterally, CTAB RRR,No Gallops,Rubs or new Murmurs, No Parasternal Heave +ve B.Sounds, Abd Soft, No tenderness, No rebound - guarding or rigidity. No Cyanosis, Clubbing or edema, No new Rash or bruise      Data Review:    CBC Recent Labs  Lab 03/08/20 1623 03/10/20 0539  WBC 8.4 6.0  HGB 14.4 12.6  HCT 43.1 39.2  PLT 180 218  MCV 93.7 95.1  MCH 31.3 30.6  MCHC 33.4 32.1  RDW 13.0  13.2    Chemistries  Recent Labs  Lab 03/08/20 1623 03/09/20 0248 03/09/20 0420 03/10/20 0818  NA 135  --  136 139  K 3.6  --  3.4* 5.2*  CL 94*  --  100 108  CO2 27  --  24 22  GLUCOSE 105*  --  146* 136*  BUN 81*  --  82* 34*  CREATININE 4.98*  --  3.97* 1.44*  CALCIUM 8.8*  --  8.5* 8.9  MG  --  2.4  --  2.0  AST 41  --   --   --   ALT 33  --   --   --   ALKPHOS 93  --   --   --   BILITOT 0.7  --   --   --    ------------------------------------------------------------------------------------------------------------------ No results for input(s): CHOL, HDL,  LDLCALC, TRIG, CHOLHDL, LDLDIRECT in the last 72 hours.  No results found for: HGBA1C ------------------------------------------------------------------------------------------------------------------ No results for input(s): TSH, T4TOTAL, T3FREE, THYROIDAB in the last 72 hours.  Invalid input(s): FREET3 ------------------------------------------------------------------------------------------------------------------ No results for input(s): VITAMINB12, FOLATE, FERRITIN, TIBC, IRON, RETICCTPCT in the last 72 hours.  Coagulation profile No results for input(s): INR, PROTIME in the last 168 hours.  No results for input(s): DDIMER in the last 72 hours.  Cardiac Enzymes No results for input(s): CKMB, TROPONINI, MYOGLOBIN in the last 168 hours.  Invalid input(s): CK ------------------------------------------------------------------------------------------------------------------ No results found for: BNP  Inpatient Medications  Scheduled Meds: . escitalopram  5 mg Oral Daily  . heparin  5,000 Units Subcutaneous Q8H  . metoprolol succinate  25 mg Oral Daily  . pantoprazole  40 mg Oral Daily  . rosuvastatin  10 mg Oral Daily   Continuous Infusions: PRN Meds:.acetaminophen, ondansetron (ZOFRAN) IV  Micro Results Recent Results (from the past 240 hour(s))  Respiratory Panel by RT PCR (Flu A&B, Covid) - Nasopharyngeal Swab     Status: None   Collection Time: 03/09/20 12:26 AM   Specimen: Nasopharyngeal Swab  Result Value Ref Range Status   SARS Coronavirus 2 by RT PCR NEGATIVE NEGATIVE Final    Comment: (NOTE) SARS-CoV-2 target nucleic acids are NOT DETECTED. The SARS-CoV-2 RNA is generally detectable in upper respiratoy specimens during the acute phase of infection. The lowest concentration of SARS-CoV-2 viral copies this assay can detect is 131 copies/mL. A negative result does not preclude SARS-Cov-2 infection and should not be used as the sole basis for treatment or other  patient management decisions. A negative result may occur with  improper specimen collection/handling, submission of specimen other than nasopharyngeal swab, presence of viral mutation(s) within the areas targeted by this assay, and inadequate number of viral copies (<131 copies/mL). A negative result must be combined with clinical observations, patient history, and epidemiological information. The expected result is Negative. Fact Sheet for Patients:  https://www.moore.com/ Fact Sheet for Healthcare Providers:  https://www.young.biz/ This test is not yet ap proved or cleared by the [place] FDA and  has been authorized for detection and/or diagnosis of SARS-CoV-2 by FDA under an Emergency Use Authorization (EUA). This EUA will remain  in effect (meaning this test can be used) for the duration of the COVID-19 declaration under Section 564(b)(1) of the Act, 21 U.S.C. section 360bbb-3(b)(1), unless the authorization is terminated or revoked sooner.    Influenza A by PCR NEGATIVE NEGATIVE Final   Influenza B by PCR NEGATIVE NEGATIVE Final    Comment: (NOTE) The Xpert Xpress SARS-CoV-2/FLU/RSV assay is intended as an  aid in  the diagnosis of influenza from Nasopharyngeal swab specimens and  should not be used as a sole basis for treatment. Nasal washings and  aspirates are unacceptable for Xpert Xpress SARS-CoV-2/FLU/RSV  testing. Fact Sheet for Patients: PinkCheek.be Fact Sheet for Healthcare Providers: GravelBags.it This test is not yet approved or cleared by the [place] FDA and  has been authorized for detection and/or diagnosis of SARS-CoV-2 by  FDA under an Emergency Use Authorization (EUA). This EUA will remain  in effect (meaning this test can be used) for the duration of the  Covid-19 declaration under Section 564(b)(1) of the Act, 21  U.S.C. section 360bbb-3(b)(1), unless  the authorization is  terminated or revoked. Performed at [facility], [place] [address], [place], [place] [address]   C Difficile Quick Screen w PCR reflex     Status: None   Collection Time: 03/09/20  7:35 AM   Specimen: STOOL  Result Value Ref Range Status   C Diff antigen NEGATIVE NEGATIVE Final   C Diff toxin NEGATIVE NEGATIVE Final   C Diff interpretation No C. difficile detected.  Final  Gastrointestinal Panel by PCR , Stool     Status: None   Collection Time: 03/09/20  7:35 AM   Specimen: STOOL  Result Value Ref Range Status   Campylobacter species NOT DETECTED NOT DETECTED Final   Plesimonas shigelloides NOT DETECTED NOT DETECTED Final   Salmonella species NOT DETECTED NOT DETECTED Final   Yersinia enterocolitica NOT DETECTED NOT DETECTED Final   Vibrio species NOT DETECTED NOT DETECTED Final   Vibrio cholerae NOT DETECTED NOT DETECTED Final   Enteroaggregative E coli (EAEC) NOT DETECTED NOT DETECTED Final   Enteropathogenic E coli (EPEC) NOT DETECTED NOT DETECTED Final   Enterotoxigenic E coli (ETEC) NOT DETECTED NOT DETECTED Final   Shiga like toxin producing E coli (STEC) NOT DETECTED NOT DETECTED Final   Shigella/Enteroinvasive E coli (EIEC) NOT DETECTED NOT DETECTED Final   Cryptosporidium NOT DETECTED NOT DETECTED Final   Cyclospora cayetanensis NOT DETECTED NOT DETECTED Final   Entamoeba histolytica NOT DETECTED NOT DETECTED Final   Giardia lamblia NOT DETECTED NOT DETECTED Final   Adenovirus F40/41 NOT DETECTED NOT DETECTED Final   Astrovirus NOT DETECTED NOT DETECTED Final   Norovirus GI/GII NOT DETECTED NOT DETECTED Final   Rotavirus A NOT DETECTED NOT DETECTED Final   Sapovirus (I, II, IV, and V) NOT DETECTED NOT DETECTED Final    Comment: Performed at [facility], [address], [place], [place] [address]    Radiology Reports DG Chest Port 1 View  Result Date: 03/08/2020 CLINICAL DATA:  Cough EXAM: PORTABLE CHEST 1 VIEW COMPARISON:   09/01/2008 FINDINGS: The heart size and mediastinal contours are within normal limits. Both lungs are clear. The visualized skeletal structures are unremarkable. IMPRESSION: Normal study. Electronically Signed   By: Rolm Baptise M.D.   On: 03/08/2020 23:48   DG Foot Complete Right  Result Date: 02/16/2020 Please see detailed radiograph report in office note.    Phillips Climes M.D on 03/10/2020 at 3:08 PM  Between 7am to 7pm - Pager - [phone]  After 7pm go to [email or web address] - password [email or web address]  [facility] -  Office  [phone]

## 2020-03-11 DIAGNOSIS — R197 Diarrhea, unspecified: Secondary | ICD-10-CM

## 2020-03-11 DIAGNOSIS — R112 Nausea with vomiting, unspecified: Secondary | ICD-10-CM

## 2020-03-11 DIAGNOSIS — E785 Hyperlipidemia, unspecified: Secondary | ICD-10-CM

## 2020-03-11 LAB — BASIC METABOLIC PANEL
Anion gap: 5 (ref 5–15)
BUN: 19 mg/dL (ref 6–20)
CO2: 28 mmol/L (ref 22–32)
Calcium: 8.6 mg/dL — ABNORMAL LOW (ref 8.9–10.3)
Chloride: 111 mmol/L (ref 98–111)
Creatinine, Ser: 1.18 mg/dL — ABNORMAL HIGH (ref 0.44–1.00)
GFR calc Af Amer: >60 mL/min (ref >60)
GFR calc non Af Amer: 53 mL/min — ABNORMAL LOW (ref >60)
Glucose, Bld: 116 mg/dL — ABNORMAL HIGH (ref 70–99)
Potassium: 5.1 mmol/L (ref 3.5–5.1)
Sodium: 144 mmol/L (ref 135–145)

## 2020-03-11 NOTE — Progress Notes (Signed)
Discharge teaching provided. Meds, diet, activity, follow up appointments reviewed and all questions answered. Copy of instructions given to patient.
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
## 2020-03-11 NOTE — Discharge Instructions (Signed)
Follow with Primary MD Chilton Greathouse, MD in 7 days   Get CBC, CMP,  checked  by Primary MD next visit.    Activity: As tolerated with Full fall precautions use walker/cane & assistance as needed   Disposition Home    Diet: Heart Healthy .   On your next visit with your primary care physician please Get Medicines reviewed and adjusted.   Please request your Prim.MD to go over all Hospital Tests and Procedure/Radiological results at the follow up, please get all Hospital records sent to your Prim MD by signing hospital release before you go home.   If you experience worsening of your admission symptoms, develop shortness of breath, life threatening emergency, suicidal or homicidal thoughts you must seek medical attention immediately by calling 911 or calling your MD immediately  if symptoms less severe.  You Must read complete instructions/literature along with all the possible adverse reactions/side effects for all the Medicines you take and that have been prescribed to you. Take any new Medicines after you have completely understood and accpet all the possible adverse reactions/side effects.   Do not drive, operating heavy machinery, perform activities at heights, swimming or participation in water activities or provide baby sitting services if your were admitted for syncope or siezures until you have seen by Primary MD or a Neurologist and advised to do so again.  Do not drive when taking Pain medications.    Do not take more than prescribed Pain, Sleep and Anxiety Medications  Special Instructions: If you have smoked or chewed Tobacco  in the last 2 yrs please stop smoking, stop any regular Alcohol  and or any Recreational drug use.  Wear Seat belts while driving.   Please note  You were cared for by a hospitalist during your hospital stay. If you have any questions about your discharge medications or the care you received while you were in the hospital after you are  discharged, you can call the unit and asked to speak with the hospitalist on call if the hospitalist that took care of you is not available. Once you are discharged, your primary care physician will handle any further medical issues. Please note that NO REFILLS for any discharge medications will be authorized once you are discharged, as it is imperative that you return to your primary care physician (or establish a relationship with a primary care physician if you do not have one) for your aftercare needs so that they can reassess your need for medications and monitor your lab values.

## 2020-03-15 ENCOUNTER — Ambulatory Visit: Payer: Medicaid Other | Admitting: Podiatry

## 2020-03-26 ENCOUNTER — Encounter: Payer: Self-pay | Admitting: Podiatry

## 2020-03-26 ENCOUNTER — Ambulatory Visit: Payer: Medicaid Other | Admitting: Podiatry

## 2020-03-26 ENCOUNTER — Other Ambulatory Visit: Payer: Self-pay

## 2020-03-26 DIAGNOSIS — M722 Plantar fascial fibromatosis: Secondary | ICD-10-CM

## 2020-03-26 DIAGNOSIS — M79671 Pain in right foot: Secondary | ICD-10-CM
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
## 2021-09-21 ENCOUNTER — Other Ambulatory Visit: Payer: Self-pay | Admitting: Internal Medicine

## 2021-09-21 DIAGNOSIS — N6320 Unspecified lump in the left breast, unspecified quadrant: Secondary | ICD-10-CM

## 2021-10-31 ENCOUNTER — Other Ambulatory Visit: Payer: Self-pay

## 2021-10-31 ENCOUNTER — Ambulatory Visit
Admission: RE | Admit: 2021-10-31 | Discharge: 2021-10-31 | Disposition: A | Discharge status: Home / Self Care | Payer: Medicaid Other | Source: Ambulatory Visit | Attending: Internal Medicine | Admitting: Internal Medicine

## 2021-10-31 DIAGNOSIS — N6320 Unspecified lump in the left breast, unspecified quadrant: Secondary | ICD-10-CM

## 2022-01-11 ENCOUNTER — Ambulatory Visit (INDEPENDENT_AMBULATORY_CARE_PROVIDER_SITE_OTHER): Payer: Medicaid Other | Admitting: Podiatry

## 2022-01-11 ENCOUNTER — Other Ambulatory Visit: Payer: Self-pay

## 2022-01-11 DIAGNOSIS — S90119A Contusion of unspecified great toe without damage to nail, initial encounter: Secondary | ICD-10-CM

## 2022-01-11 MED ORDER — TRAMADOL HCL 50 MG PO TABS: 50.0000 mg | ORAL_TABLET | Freq: Three times a day (TID) | ORAL | 0 refills | Status: AC | PRN
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
## 2022-04-14 IMAGING — MG DIGITAL DIAGNOSTIC BILAT W/ TOMO W/ CAD
8 series · 8 of 24 positions shown · non-contrast
Comparison: Previous exam(s).

CLINICAL DATA: Delayed followup for probably benign LEFT breast
mass first noted in 6632.

EXAM:
DIGITAL DIAGNOSTIC BILATERAL MAMMOGRAM WITH TOMOSYNTHESIS AND CAD
TECHNIQUE: Bilateral digital diagnostic mammography and breast tomosynthesis
was performed. The images were evaluated with computer-aided
detection.

[R MLO synth-2D]
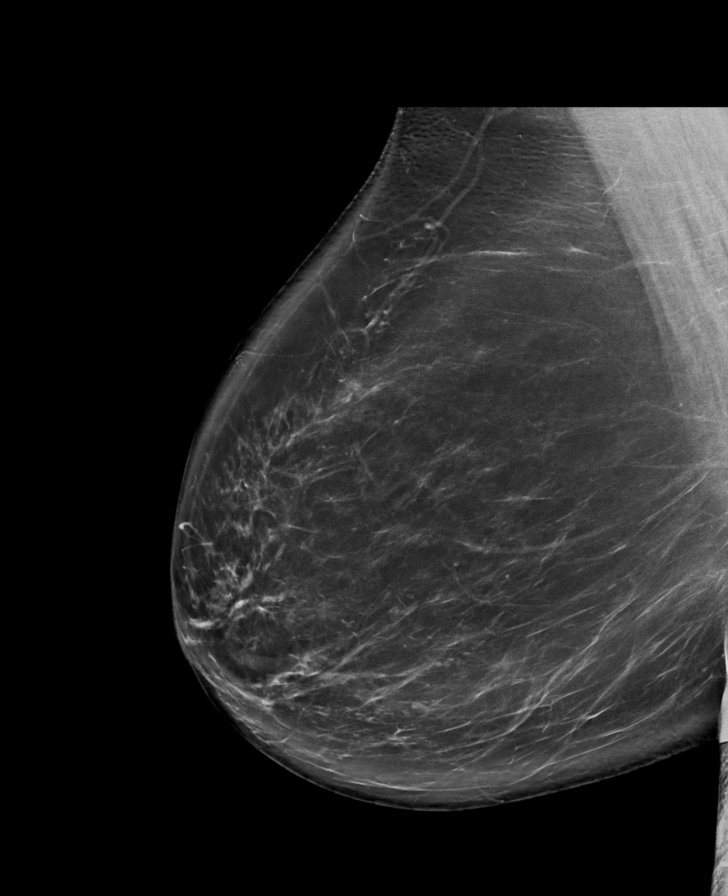

[R CC synth-2D]
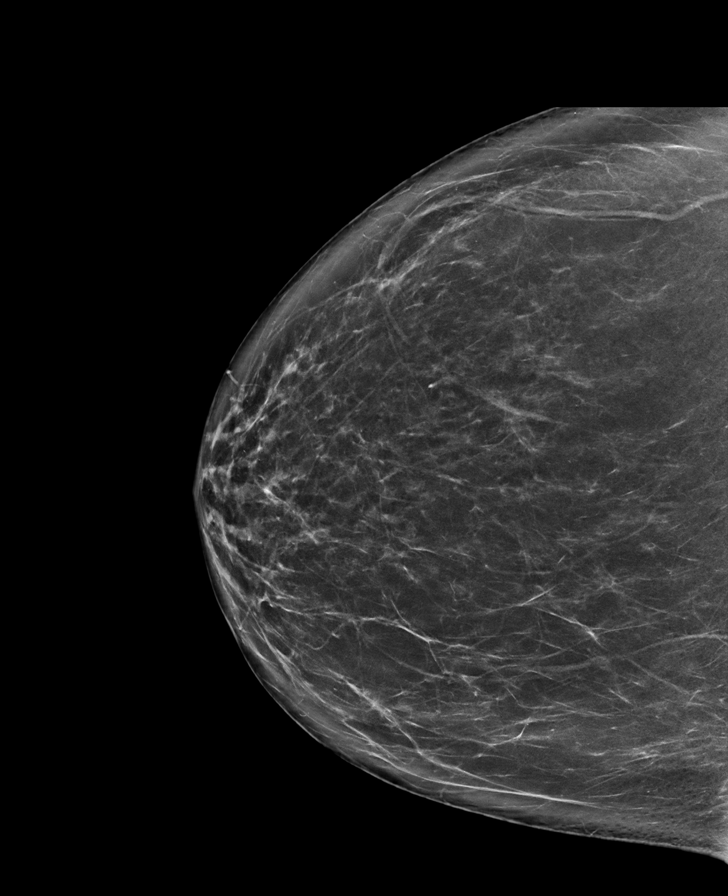

[L CC synth-2D]
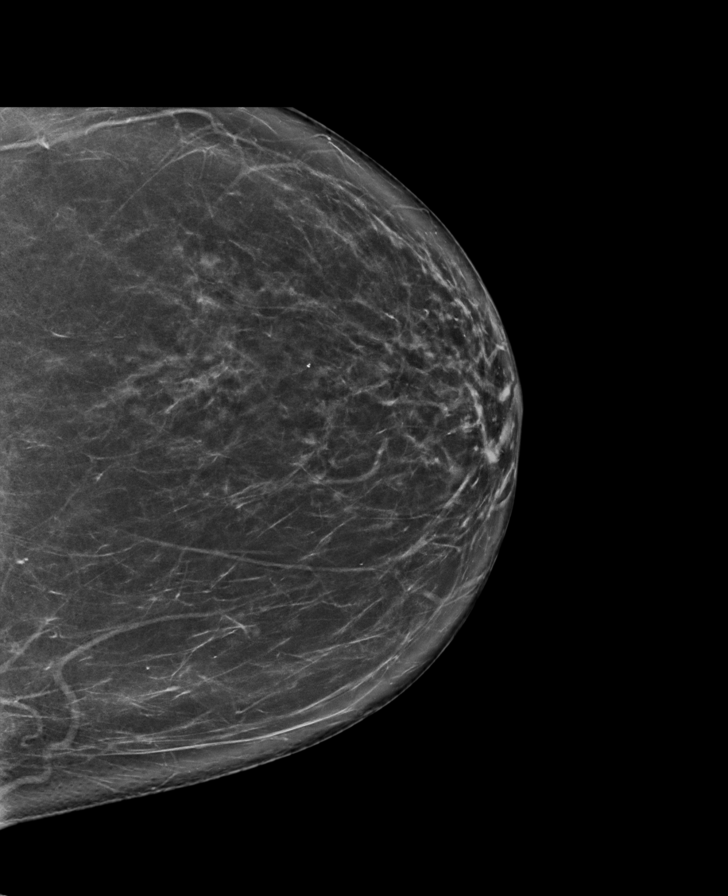

[L MLO synth-2D]
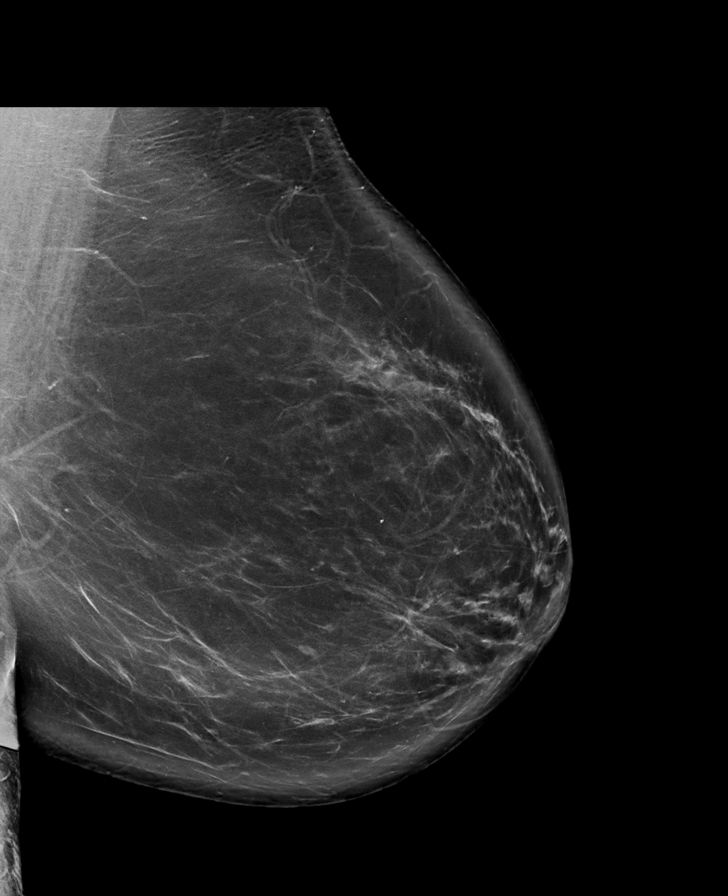

[R CC tomo · tomo slice 43/85.0]
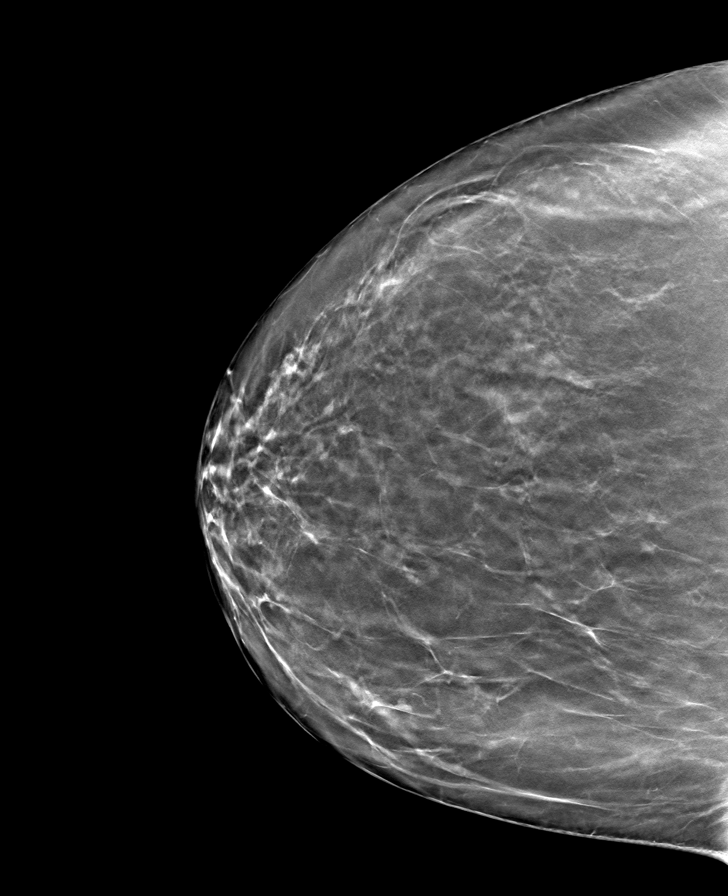

[L CC tomo · tomo slice 42/83.0]
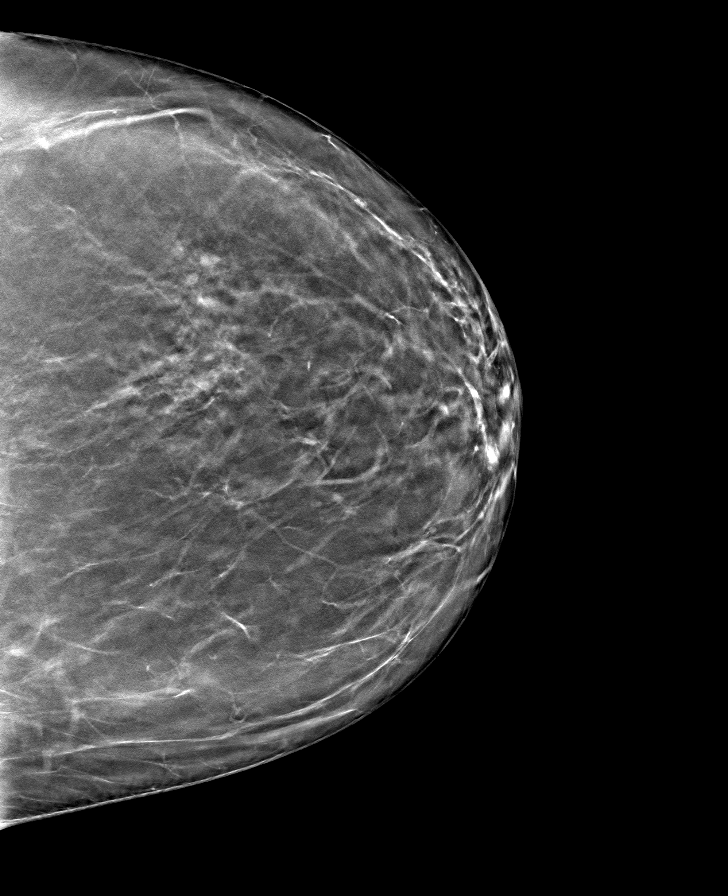

[R MLO tomo · tomo slice 53/106.0]
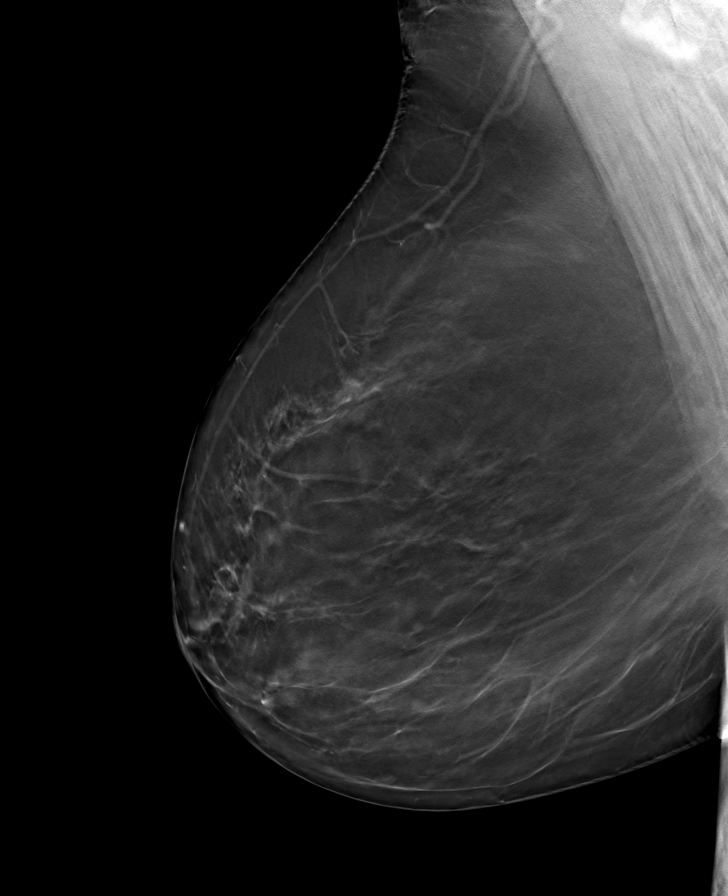

[L MLO tomo · tomo slice 53/105.0]
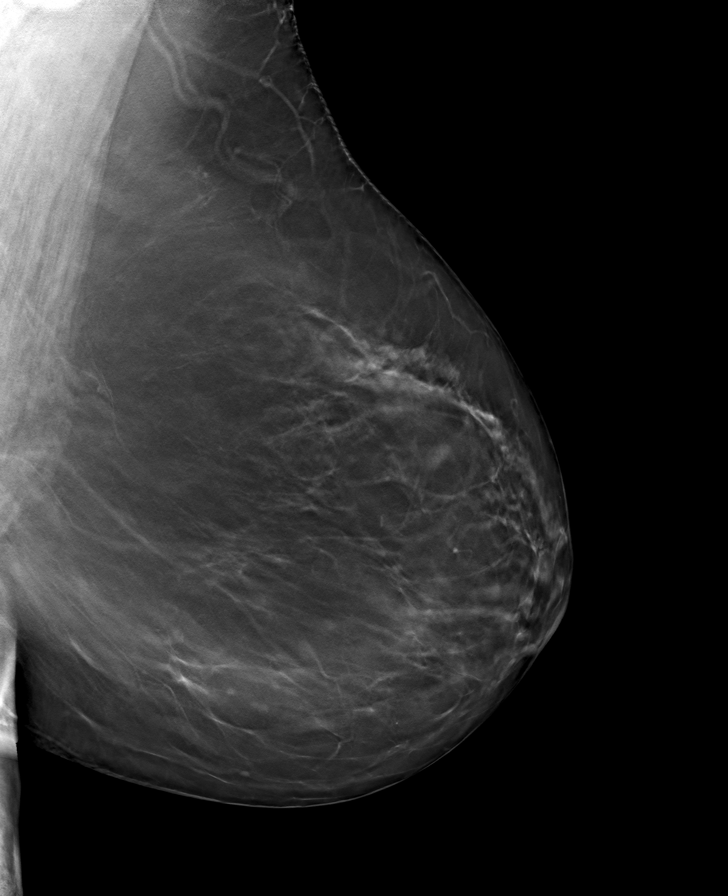

[8 of 24 positions shown; findings below may reference images not displayed]

ACR Breast Density Category b: There are scattered areas of
fibroglandular density.
FINDINGS: No suspicious mass, distortion, or microcalcifications are
identified to suggest presence of malignancy. The probably benign
mass previously noted in the central portion of the LEFT breast is
no longer apparent.
IMPRESSION: No mammographic evidence for malignancy.

RECOMMENDATION:
Screening mammogram in one year.(Code:[id number]-X-CO3)

I have discussed the findings and recommendations with the patient.
If applicable, a reminder letter will be sent to the patient
regarding the next appointment.

BI-RADS CATEGORY  1: Negative.

## 2023-03-28 ENCOUNTER — Other Ambulatory Visit: Payer: Self-pay | Admitting: Internal Medicine

## 2023-03-28 DIAGNOSIS — Z1231 Encounter for screening mammogram for malignant neoplasm of breast: Secondary | ICD-10-CM

## 2023-05-09 ENCOUNTER — Ambulatory Visit
Admission: RE | Admit: 2023-05-09 | Discharge: 2023-05-09 | Disposition: A | Discharge status: Home / Self Care | Payer: Medicaid Other | Source: Ambulatory Visit | Attending: Internal Medicine | Admitting: Internal Medicine

## 2023-05-09 DIAGNOSIS — Z1231 Encounter for screening mammogram for malignant neoplasm of breast: Secondary | ICD-10-CM

## 2023-05-19 ENCOUNTER — Encounter (HOSPITAL_COMMUNITY): Payer: Self-pay

## 2023-05-19 ENCOUNTER — Emergency Department (HOSPITAL_COMMUNITY): Payer: Medicaid Other

## 2023-05-19 ENCOUNTER — Other Ambulatory Visit: Payer: Self-pay

## 2023-05-19 ENCOUNTER — Emergency Department (HOSPITAL_COMMUNITY)
Admission: EM | Admit: 2023-05-19 | Discharge: 2023-05-19 | Disposition: A | Discharge status: Home / Self Care | Payer: Medicaid Other | Attending: Emergency Medicine | Admitting: Emergency Medicine

## 2023-05-19 DIAGNOSIS — E860 Dehydration: Secondary | ICD-10-CM | POA: Insufficient documentation

## 2023-05-19 DIAGNOSIS — Z87442 Personal history of urinary calculi: Secondary | ICD-10-CM | POA: Insufficient documentation

## 2023-05-19 DIAGNOSIS — I10 Essential (primary) hypertension: Secondary | ICD-10-CM | POA: Diagnosis not present

## 2023-05-19 DIAGNOSIS — Z20822 Contact with and (suspected) exposure to covid-19: Secondary | ICD-10-CM | POA: Insufficient documentation

## 2023-05-19 DIAGNOSIS — R55 Syncope and collapse: Secondary | ICD-10-CM | POA: Diagnosis present

## 2023-05-19 DIAGNOSIS — N179 Acute kidney failure, unspecified: Secondary | ICD-10-CM | POA: Insufficient documentation

## 2023-05-19 DIAGNOSIS — Z79899 Other long term (current) drug therapy: Secondary | ICD-10-CM | POA: Diagnosis not present

## 2023-05-19 LAB — URINALYSIS, W/ REFLEX TO CULTURE (INFECTION SUSPECTED)
Bilirubin Urine: NEGATIVE
Glucose, UA: NEGATIVE mg/dL
Hgb urine dipstick: NEGATIVE
Ketones, ur: NEGATIVE mg/dL
Leukocytes,Ua: NEGATIVE
Nitrite: NEGATIVE
Protein, ur: 100 mg/dL — AB
Specific Gravity, Urine: 1.014 (ref 1.005–1.030)
pH: 5 (ref 5.0–8.0)

## 2023-05-19 LAB — RESP PANEL BY RT-PCR (RSV, FLU A&B, COVID)  RVPGX2
Influenza A by PCR: NEGATIVE
Influenza B by PCR: NEGATIVE
Resp Syncytial Virus by PCR: NEGATIVE
SARS Coronavirus 2 by RT PCR: NEGATIVE

## 2023-05-19 LAB — CBC
HCT: 47.5 % — ABNORMAL HIGH (ref 36.0–46.0)
Hemoglobin: 15.6 g/dL — ABNORMAL HIGH (ref 12.0–15.0)
MCH: 30.8 pg (ref 26.0–34.0)
MCHC: 32.8 g/dL (ref 30.0–36.0)
MCV: 93.7 fL (ref 80.0–100.0)
Platelets: 285 10*3/uL (ref 150–400)
RBC: 5.07 MIL/uL (ref 3.87–5.11)
RDW: 12 % (ref 11.5–15.5)
WBC: 16.5 10*3/uL — ABNORMAL HIGH (ref 4.0–10.5)
nRBC: 0 % (ref 0.0–0.2)

## 2023-05-19 LAB — TROPONIN I (HIGH SENSITIVITY)
Troponin I (High Sensitivity): 6 ng/L (ref <18)
Troponin I (High Sensitivity): 6 ng/L (ref <18)

## 2023-05-19 LAB — BASIC METABOLIC PANEL
Anion gap: 13 (ref 5–15)
BUN: 25 mg/dL — ABNORMAL HIGH (ref 6–20)
CO2: 25 mmol/L (ref 22–32)
Calcium: 9.7 mg/dL (ref 8.9–10.3)
Chloride: 97 mmol/L — ABNORMAL LOW (ref 98–111)
Creatinine, Ser: 2.33 mg/dL — ABNORMAL HIGH (ref 0.44–1.00)
GFR, Estimated: 24 mL/min — ABNORMAL LOW (ref >60)
Glucose, Bld: 128 mg/dL — ABNORMAL HIGH (ref 70–99)
Potassium: 5.2 mmol/L — ABNORMAL HIGH (ref 3.5–5.1)
Sodium: 135 mmol/L (ref 135–145)

## 2023-05-19 LAB — LACTIC ACID, PLASMA
Lactic Acid, Venous: 1.7 mmol/L (ref 0.5–1.9)
Lactic Acid, Venous: 0.9 mmol/L (ref 0.5–1.9)

## 2023-05-19 LAB — HEPATIC FUNCTION PANEL
ALT: 20 U/L (ref 0–44)
AST: 19 U/L (ref 15–41)
Albumin: 4 g/dL (ref 3.5–5.0)
Alkaline Phosphatase: 103 U/L (ref 38–126)
Bilirubin, Direct: 0.1 mg/dL (ref 0.0–0.2)
Indirect Bilirubin: 0.3 mg/dL (ref 0.3–0.9)
Total Bilirubin: 0.4 mg/dL (ref 0.3–1.2)
Total Protein: 7.9 g/dL (ref 6.5–8.1)

## 2023-05-19 LAB — MAGNESIUM: Magnesium: 1.9 mg/dL (ref 1.7–2.4)

## 2023-05-19 LAB — LIPASE, BLOOD: Lipase: 25 U/L (ref 11–51)

## 2023-05-19 LAB — CK: Total CK: 83 U/L (ref 38–234)

## 2023-05-19 LAB — CBG MONITORING, ED: Glucose-Capillary: 124 mg/dL — ABNORMAL HIGH (ref 70–99)

## 2023-05-19 MED ORDER — SODIUM CHLORIDE 0.9 % IV BOLUS
1000.0000 mL | Freq: Once | INTRAVENOUS | Status: AC
  Administered 2023-05-19: 1000 mL via INTRAVENOUS

## 2023-05-19 MED ORDER — PROCHLORPERAZINE EDISYLATE 10 MG/2ML IJ SOLN
10.0000 mg | Freq: Once | INTRAMUSCULAR | Status: AC
  Administered 2023-05-19: 10 mg via INTRAVENOUS
  Filled 2023-05-19: qty 2

## 2023-05-19 MED ORDER — DIPHENHYDRAMINE HCL 50 MG/ML IJ SOLN
50.0000 mg | Freq: Once | INTRAMUSCULAR | Status: AC
  Administered 2023-05-19: 50 mg via INTRAVENOUS
  Filled 2023-05-19: qty 1

## 2023-05-19 MED ORDER — ONDANSETRON HCL 4 MG/2ML IJ SOLN
4.0000 mg | Freq: Once | INTRAMUSCULAR | Status: AC
  Administered 2023-05-19: 4 mg via INTRAVENOUS
  Filled 2023-05-19: qty 2

## 2023-05-19 NOTE — ED Triage Notes (Signed)
Pt c/o lightheadedness and nausea while at work.  Pt reports she felt like she got overheated.  Sts she still feels lightheaded even after cooling down.  Denies pain.

## 2023-05-19 NOTE — Discharge Instructions (Signed)
Your history, exam, and workup today showed evidence of kidney injury and dehydration but workup was otherwise reassuring.  We feel you are safe for discharge home as you are able to eat and drink normally and please rest and hydrate.  Please follow-up with your regular doctor for repeat kidney function testing this week.  If any symptoms change or worsen acutely, please return to the nearest emergency department.

## 2023-05-19 NOTE — ED Provider Notes (Signed)
[place] EMERGENCY DEPARTMENT AT [facility] Provider Note   CSN: [id number] Arrival date & time: 05/19/23  1333     History  Chief Complaint  Patient presents with   Near Syncope   Nausea    Emily Romero is a 57 y.o. female.  The history is provided by the patient and medical records. No language interpreter was used.  Near Syncope This is a new problem. The current episode started 3 to 5 hours ago. The problem occurs rarely. The problem has not changed since onset.Associated symptoms include abdominal pain. Pertinent negatives include no chest pain, no headaches and no shortness of breath. Nothing aggravates the symptoms. Nothing relieves the symptoms. She has tried nothing for the symptoms. The treatment provided no relief.       Home Medications Prior to Admission medications   Medication Sig Start Date End Date Taking? Authorizing Provider  acetaminophen (TYLENOL) 325 MG tablet Take 650 mg by mouth every 6 (six) hours as needed for mild pain or fever.     [provider]  amLODipine (NORVASC) 5 MG tablet Take 5 mg by mouth daily. 02/11/20   [provider]  buPROPion (WELLBUTRIN SR) 150 MG 12 hr tablet Take 150 mg by mouth daily. 01/07/20   [provider]  escitalopram (LEXAPRO) 5 MG tablet Take 5 mg by mouth daily. 09/23/19   [provider]  metoprolol succinate (TOPROL-XL) 25 MG 24 hr tablet Take 25 mg by mouth daily.    [provider]  pantoprazole (PROTONIX) 40 MG tablet Take 40 mg by mouth daily. 01/02/20   [provider]  rosuvastatin (CRESTOR) 10 MG tablet Take 10 mg by mouth daily. 01/20/20   [provider]  SUMAtriptan (IMITREX) 100 MG tablet Take 100 mg by mouth every 2 (two) hours as needed for migraine. May repeat in 2 hours if headache persists or recurs.    [provider]      Allergies    Patient has no known allergies.    Review of Systems   Review of Systems   Constitutional:  Positive for chills, diaphoresis and fatigue. Negative for fever.  HENT:  Negative for congestion.   Eyes:  Negative for visual disturbance.  Respiratory:  Negative for chest tightness and shortness of breath.   Cardiovascular:  Positive for near-syncope. Negative for chest pain, palpitations and leg swelling.  Gastrointestinal:  Positive for abdominal pain and nausea. Negative for constipation, diarrhea and vomiting.  Genitourinary:  Negative for dysuria, vaginal bleeding, vaginal discharge and vaginal pain.       Darkened urine  Musculoskeletal:  Negative for back pain, neck pain and neck stiffness.  Skin:  Negative for rash and wound.  Neurological:  Positive for light-headedness. Negative for syncope, weakness and headaches.  Psychiatric/Behavioral:  Negative for agitation.   All other systems reviewed and are negative.   Physical Exam Updated Vital Signs BP 131/78 (BP Location: Right Arm)   Pulse (!) 102   Resp 18   Ht 5\' 7"  (1.702 m)   Wt 89.8 kg   LMP 10/13/2016 (Approximate)   SpO2 100%   BMI 31.01 kg/m  Physical Exam Vitals and nursing note reviewed.  Constitutional:      General: She is not in acute distress.    Appearance: She is well-developed. She is not ill-appearing, toxic-appearing or diaphoretic.  HENT:     Head: Normocephalic and atraumatic.     Nose: Nose normal. No congestion or rhinorrhea.  Mouth/Throat:     Mouth: Mucous membranes are dry.     Pharynx: No oropharyngeal exudate or posterior oropharyngeal erythema.  Eyes:     Extraocular Movements: Extraocular movements intact.     Conjunctiva/sclera: Conjunctivae normal.     Pupils: Pupils are equal, round, and reactive to light.  Cardiovascular:     Rate and Rhythm: Regular rhythm. Tachycardia present.     Pulses: Normal pulses.     Heart sounds: No murmur heard. Pulmonary:     Effort: Pulmonary effort is normal. No respiratory distress.     Breath sounds: Normal breath  sounds. No wheezing, rhonchi or rales.  Chest:     Chest wall: No tenderness.  Abdominal:     General: Abdomen is flat.     Palpations: Abdomen is soft.     Tenderness: There is abdominal tenderness in the right upper quadrant and epigastric area. There is no right CVA tenderness, left CVA tenderness, guarding or rebound.    Musculoskeletal:        General: No swelling or tenderness.     Cervical back: Neck supple. No tenderness.     Right lower leg: No edema.     Left lower leg: No edema.  Skin:    General: Skin is warm and dry.     Capillary Refill: Capillary refill takes less than 2 seconds.     Coloration: Skin is not pale.     Findings: No erythema or rash.  Neurological:     General: No focal deficit present.     Mental Status: She is alert.     Sensory: No sensory deficit.     Motor: No weakness.  Psychiatric:        Mood and Affect: Mood normal.    ED Results / Procedures / Treatments   Labs (all labs ordered are listed, but only abnormal results are displayed) Labs Reviewed  BASIC METABOLIC PANEL - Abnormal; Notable for the following components:      Result Value   Potassium 5.2 (*)    Chloride 97 (*)    Glucose, Bld 128 (*)    BUN 25 (*)    Creatinine, Ser 2.33 (*)    GFR, Estimated 24 (*)    All other components within normal limits  CBC - Abnormal; Notable for the following components:   WBC 16.5 (*)    Hemoglobin 15.6 (*)    HCT 47.5 (*)    All other components within normal limits  URINALYSIS, W/ REFLEX TO CULTURE (INFECTION SUSPECTED) - Abnormal; Notable for the following components:   APPearance HAZY (*)    Protein, ur 100 (*)    Bacteria, UA RARE (*)    All other components within normal limits  CBG MONITORING, ED - Abnormal; Notable for the following components:   Glucose-Capillary 124 (*)    All other components within normal limits  RESP PANEL BY RT-PCR (RSV, FLU A&B, COVID)  RVPGX2  HEPATIC FUNCTION PANEL  LIPASE, BLOOD  CK  MAGNESIUM   LACTIC ACID, PLASMA  LACTIC ACID, PLASMA  TROPONIN I (HIGH SENSITIVITY)  TROPONIN I (HIGH SENSITIVITY)    EKG EKG Interpretation Date/Time:  Saturday May 19 2023 13:25:14 EDT Ventricular Rate:  100 PR Interval:  144 QRS Duration:  80 QT Interval:  338 QTC Calculation: 436 R Axis:   25  Text Interpretation: Normal sinus rhythm Normal ECG When compared with ECG of 09-Mar-2020 00:16, PREVIOUS ECG IS PRESENT when compared top rior, similar appearance with  faster rate. No STEMI Confirmed by Theda Belfast ([id number]) on 05/19/2023 3:29:47 PM  Radiology US Abdomen Limited RUQ (LIVER/GB)  Result Date: 05/19/2023 CLINICAL DATA:  Epigastric pain EXAM: ULTRASOUND ABDOMEN LIMITED RIGHT UPPER QUADRANT COMPARISON:  CT abdomen without contrast June 20, 2005 FINDINGS: Gallbladder: No gallstones or wall thickening visualized. No sonographic Murphy sign noted by sonographer. Common bile duct: Diameter: 3 mm Liver: No focal lesion identified. Within normal limits in parenchymal echogenicity. Portal vein is patent on color Doppler imaging with normal direction of blood flow towards the liver. Other: Incidentally visualized exophytic benign simple renal cyst, originating from the superior pole of the right kidney, measuring 2.4 x 2.0 x 2.1 cm. IMPRESSION: No etiology for epigastric pain identified. Electronically Signed   By: Jacob Moores M.D.   On: 05/19/2023 16:51   DG Chest Portable 1 View  Result Date: 05/19/2023 CLINICAL DATA:  Diaphoresis, hypotension, and nausea and vomiting EXAM: PORTABLE CHEST 1 VIEW COMPARISON:  None Available. FINDINGS: The cardiomediastinal silhouette is unchanged in contour. No focal pulmonary opacity. No pleural effusion or pneumothorax. The visualized upper abdomen is unremarkable. No acute osseous abnormality. IMPRESSION: No acute cardiopulmonary abnormality. Electronically Signed   By: Jacob Moores M.D.   On: 05/19/2023 16:46    Procedures Procedures     Medications Ordered in ED Medications  sodium chloride 0.9 % bolus 1,000 mL (0 mLs Intravenous Stopped 05/19/23 2039)  sodium chloride 0.9 % bolus 1,000 mL (0 mLs Intravenous Stopped 05/19/23 1600)  ondansetron (ZOFRAN) injection 4 mg (4 mg Intravenous Given 05/19/23 1550)  prochlorperazine (COMPAZINE) injection 10 mg (10 mg Intravenous Given 05/19/23 1808)  diphenhydrAMINE (BENADRYL) injection 50 mg (50 mg Intravenous Given 05/19/23 1808)    ED Course/ Medical Decision Making/ A&P                             Medical Decision Making Amount and/or Complexity of Data Reviewed Labs: ordered. Radiology: ordered.  Risk Prescription drug management.    ARION MATTOX is a 57 y.o. female with a past medical history significant for hypertension, dyslipidemia, GERD, depression, kidney stones, previous endometrial ablation, and history of previous dehydration and acute kidney injury who presents with nausea, vomiting, epigastric discomfort, chills, diaphoresis, near syncope, lightheadedness, and fatigue.  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  She reports that she had to sit down and felt she was going to pass out.  She denies chest pain, palpitation, shortness of breath but was reporting some upper abdominal discomfort with nausea and vomiting.  She still has her gallbladder and has no family history of gallbladder disease to her knowledge.  She reports no constipation or diarrhea but has been having darkened urine compared to her baseline.  Denies any back or flank pains.  She reports she is having cramping in her abdomen but also cramping in her legs intermittently.  She think she is very dehydrated.  Upon initial evaluation blood pressure was 80 systolic and she was lightheaded.  She was tachycardic on arrival but that has improved.  Patient denies any congestion, cough, or other URI symptoms.  She  denies any constipation or diarrhea and denies any blood in her emesis or stools recently.  Denies any other vaginal complaints.  On my exam, patient is hypotensive and feeling lightheaded.  Her lungs were clear and chest was nontender.  Abdomen  was tender across her epigastric area and right upper quadrant.  Bowel sounds were appreciated.  Flanks and back nontender.  Patient has good pulses in extremities.  She is reporting some cramping but no focal tenderness on extremity exam.  No focal neurologic deficits initially.  Clinically I am concerned about recurrent dehydration however with her feeling hot and overheated we will get a temperature and get a CK.  Will get other labs including LFTs and lipase with her upper abdominal discomfort.  Will get Doppler ultrasound.  We will however get a chest x-ray EKG and troponin given the near syncope, hypotension, nausea, vomiting, and upper abdominal discomfort.  Will give her some fluids.  Will reassess after rehydration and workup to determine disposition.        Patient feels much better after fluids.  Her workup was overall reassuring aside from evidence of acute kidney injury again.  Patient was able to eat and drink and tolerated p.o. normally.  We discussed admission for rehydration and an AKI but she would rather go home and see her doctor this week for repeat renal evaluation.  No evidence of rhabdo at this time.  Patient feeling much better.  Patient be discharged home for outpatient follow-up.  Patient had no other questions or concerns and was discharged in good condition.        Final Clinical Impression(s) / ED Diagnoses Final diagnoses:  Dehydration  Near syncope  AKI (acute kidney injury) (HCC)    Rx / DC Orders ED Discharge Orders     None       Clinical Impression: 1. Dehydration   2. Near syncope   3. AKI (acute kidney injury) (HCC)     Disposition: Discharge  Condition: Good  I have discussed the results, Dx  and Tx plan with the pt(& family if present). He/she/they expressed understanding and agree(s) with the plan. Discharge instructions discussed at great length. Strict return precautions discussed and pt &/or family have verbalized understanding of the instructions. No further questions at time of discharge.    New Prescriptions   No medications on file    Follow Up: Chilton Greathouse, MD [address] [place] [address] [phone]     [facility] Emergency Department at [facility] [address] [id number] mc [place] [address] [phone]        Sohil Timko, Canary Brim, MD 05/19/23 2132

## 2023-07-18 ENCOUNTER — Ambulatory Visit (INDEPENDENT_AMBULATORY_CARE_PROVIDER_SITE_OTHER): Payer: Medicaid Other | Admitting: Neurology

## 2023-07-18 ENCOUNTER — Encounter: Payer: Self-pay | Admitting: Neurology

## 2023-07-18 VITALS — BP 128/84 | HR 68 | Ht 66.0 in | Wt 246.0 lb

## 2023-07-18 DIAGNOSIS — R0683 Snoring: Secondary | ICD-10-CM | POA: Diagnosis not present

## 2023-07-18 DIAGNOSIS — Z9189 Other specified personal risk factors, not elsewhere classified: Secondary | ICD-10-CM | POA: Diagnosis not present

## 2023-07-18 DIAGNOSIS — R351 Nocturia: Secondary | ICD-10-CM

## 2023-07-18 DIAGNOSIS — R635 Abnormal weight gain: Secondary | ICD-10-CM

## 2023-07-18 DIAGNOSIS — R519 Headache, unspecified: Secondary | ICD-10-CM | POA: Diagnosis not present

## 2023-07-18 DIAGNOSIS — E669 Obesity, unspecified: Secondary | ICD-10-CM

## 2023-07-18 DIAGNOSIS — G4719 Other hypersomnia: Secondary | ICD-10-CM | POA: Diagnosis not present
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
## 2023-07-18 NOTE — Patient Instructions (Signed)

## 2023-07-25 ENCOUNTER — Telehealth: Payer: Self-pay | Admitting: Neurology

## 2023-07-25 NOTE — Telephone Encounter (Signed)
[facility]PSG [facility] [place]: [id number] (exp. 07/24/23 to 10/20/23)   Raye Sorrow message

## 2023-07-25 NOTE — Telephone Encounter (Signed)
NPSG- [facility] auth: [id number] (exp. 07/24/23 to 10/20/23)   Patient is scheduled at [facility] for 10/08/23 at 9 pm.

## 2023-10-09 ENCOUNTER — Ambulatory Visit: Payer: Medicaid Other | Admitting: Neurology

## 2023-10-09 DIAGNOSIS — G4733 Obstructive sleep apnea (adult) (pediatric): Secondary | ICD-10-CM | POA: Diagnosis not present

## 2023-10-09 DIAGNOSIS — R0683 Snoring: Secondary | ICD-10-CM

## 2023-10-09 DIAGNOSIS — E669 Obesity, unspecified: Secondary | ICD-10-CM

## 2023-10-09 DIAGNOSIS — G4761 Periodic limb movement disorder: Secondary | ICD-10-CM

## 2023-10-09 DIAGNOSIS — G4720 Circadian rhythm sleep disorder, unspecified type: Secondary | ICD-10-CM

## 2023-10-09 DIAGNOSIS — R351 Nocturia: Secondary | ICD-10-CM

## 2023-10-09 DIAGNOSIS — G4719 Other hypersomnia: Secondary | ICD-10-CM

## 2023-10-09 DIAGNOSIS — R635 Abnormal weight gain: Secondary | ICD-10-CM

## 2023-10-09 DIAGNOSIS — G4734 Idiopathic sleep related nonobstructive alveolar hypoventilation: Secondary | ICD-10-CM

## 2023-10-09 DIAGNOSIS — R519 Headache, unspecified: Secondary | ICD-10-CM

## 2023-10-09 DIAGNOSIS — Z9189 Other specified personal risk factors, not elsewhere classified: Secondary | ICD-10-CM
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
## 2023-10-17 NOTE — Addendum Note (Signed)
Addended by: Huston Foley on: 10/17/2023 01:12 PM   Modules accepted: Orders

## 2023-10-22 ENCOUNTER — Telehealth: Payer: Self-pay | Admitting: *Deleted

## 2023-10-22 NOTE — Telephone Encounter (Signed)
-----   Message from Huston Foley sent at 10/17/2023  1:12 PM EST ----- Patient referred by PCP, seen by me on 07/18/23, patient had a split night sleep study on 10/09/23. Please call and notify patient that the recent sleep study showed severe obstructive sleep apnea (OSA). She did well with CPAP during the study with significant improvement of the respiratory events. I would like start the patient on a CPAP machine for home use. I placed the order in the chart.  Please advise patient that we will need a follow up appointment with either myself or one of our nurse practitioners in about 2-3 months post set-up to check for how they are doing on treatment and how well it's going with the machine in general. Most insurance company require a certain compliance percentage to continue to cover/pay for the machine. Please ask patient to schedule this FU appointment, according to the set-up date, which is the day they receive the machine. Please make sure, the patient understands the importance of keeping this window for the FU appointment, as it is often an Barista and not our rule. Failing to adhere to this may result in losing coverage for sleep apnea treatment, at which point some insurances require repeating the whole process. Plus, monitoring compliance data is usually good feedback for the patient as far as how they are doing, how many hours they are on it, how well the mask fits, etc.  Also remind patient, that any PAP machine or mask issues should be first addressed with the DME company, who provided the machine/supplies.  Please ask if patient has a preference regarding DME company, may depend on the insurance too.  Huston Foley, MD, PhD [facility])

## 2023-10-22 NOTE — Telephone Encounter (Signed)
Called pt & LVM with office number and hours asking for call back.  ?

## 2023-10-24 ENCOUNTER — Telehealth: Payer: Self-pay

## 2023-10-24 NOTE — Telephone Encounter (Signed)
I called pt back and discussed her sleep study results. The patient verbalized understanding of SS results indicating severe OSA but that pt did well on cpap. She is amenable to proceed with setup at home. We discussed the insurance compliance requirements which includes using the machine at least 4 hours at night and also being seen by our office for initial follow-up between 30 and 90 days after setup. Pt was scheduled for initial f/u on 01/24/23 with Amy NP. Pt verbalized appreciation for the call.   Urgent referral sent to [facility]. Result report sent to referring provider.

## 2023-10-24 NOTE — Telephone Encounter (Signed)
Call back charted in other encounter.

## 2023-10-24 NOTE — Telephone Encounter (Signed)
Pt LVM on sleep lab phone returning RN's call about SS results.

## 2023-10-25 NOTE — Telephone Encounter (Signed)
Zott, Linnell Fulling, Otilio Jefferson, RN; Zott, Kennyth Arnold Got It Thank [place]

## 2023-11-29 ENCOUNTER — Encounter: Payer: Self-pay | Admitting: Anesthesiology
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
## 2024-01-24 ENCOUNTER — Ambulatory Visit: Payer: Medicaid Other | Admitting: Family Medicine

## 2024-01-24 DIAGNOSIS — G4733 Obstructive sleep apnea (adult) (pediatric): Secondary | ICD-10-CM

## 2024-05-05 ENCOUNTER — Telehealth: Payer: Self-pay

## 2024-05-05 NOTE — Telephone Encounter (Signed)
 Received urgent referral from Bertha Broad AG-NP at [facility] for sleep eval - severe OSA and increased headaches. Placed in sleep mailbox

## 2024-05-14 ENCOUNTER — Telehealth: Payer: Self-pay

## 2024-05-14 NOTE — Telephone Encounter (Signed)
 Received referral for patient for severe OSA and headaches - pt is in an active patient in our office and just completed a sleep study with us  last year. Does not appear patient had a follow up after SS results, possibly needs an office visit to discuss new concerns/plan of care?

## 2024-05-14 NOTE — Telephone Encounter (Signed)
 I called pt and LMVM for her to return call about her cpap.  Her pcp called us  re: to referral.

## 2024-05-19 NOTE — Telephone Encounter (Signed)
 I called pt and she said that she got cpap, not using it, saw pcp and he encouraged her to restart the process over now that she has medicaid.  She had not reached out to [facility].  I called and LMVM but also sent community message as well.

## 2024-05-20 NOTE — Telephone Encounter (Signed)
 RE: pap machine Received: Today Zott, Glade Neysa Nena GORMAN, RN; Zott, Glade; Darrel, Verneita That is the insurance we have on file for her.  She received her cpap from us  12/06/2023 and failed to meet the initial compliance and due to this her insurance has stopped paying for the equipment. We made several attempts by phone, text, letters to contact her to discuss and assist in any way to help her meet compliance, none of which she responded to.  We have now been attempting to pickup the equipment again with no response.  At this point in order for insurance to start covering a PAP she would need to return the cpap and start the process over with a new office visit, sleep test, and order. We would then be able to dispense new equipment to her. Thank you     Previous Messages    ----- Message ----- From: Neysa Nena GORMAN, RN Sent: 05/20/2024   8:53 AM EDT To: Stacy Zott Subject: FW: pap machine                                She told me it was [facility],  but I left her a VM to return call to confirm that.  Particia RN ----- Message ----- From: Zott, Stacy Sent: 05/20/2024   8:21 AM EDT To: Madelin Donnice Verneita Darrel; Nena GORMAN Neysa,* Subject: RE: pap machine                                Do you know what her new insurance is, that is going to determine what we need?  We have been attempting to contact this patient for months with no response. ----- Message ----- From: Neysa Nena GORMAN, RN Sent: 05/19/2024   3:36 PM EDT To: Madelin Donnice Verneita Darrel; Stacy Zott Subject: pap machine                                    This pt is calling us  about her machine and has new insurance.  She has machine stopped using, now wants to restart.  Can you relay any info and what you need on this part.    Emily Romero  Emily Romero Female, 58 y.o., 1966/07/22 Pronouns: she/her/hers MRN: [id number]

## 2024-05-21 NOTE — Telephone Encounter (Signed)
 I called pt and LMVM for her that to call and we can make appt for her.

## 2024-05-28 ENCOUNTER — Ambulatory Visit (INDEPENDENT_AMBULATORY_CARE_PROVIDER_SITE_OTHER): Admitting: Adult Health

## 2024-05-28 VITALS — BP 140/94 | HR 83 | Ht 66.0 in | Wt 260.6 lb

## 2024-05-28 DIAGNOSIS — G4733 Obstructive sleep apnea (adult) (pediatric): Secondary | ICD-10-CM

## 2024-05-28 NOTE — Patient Instructions (Signed)
 Your Plan:  Home sleep test ordered    Thank you for coming to see us  at [facility]. I hope we have been able to provide you high quality care today.  You may receive a patient satisfaction survey over the next few weeks. We would appreciate your feedback and comments so that we may continue to improve ourselves and the health of our patients.
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
## 2024-06-13 ENCOUNTER — Ambulatory Visit (INDEPENDENT_AMBULATORY_CARE_PROVIDER_SITE_OTHER): Admitting: Neurology

## 2024-06-13 DIAGNOSIS — G4733 Obstructive sleep apnea (adult) (pediatric): Secondary | ICD-10-CM | POA: Diagnosis not present

## 2024-06-13 DIAGNOSIS — G4734 Idiopathic sleep related nonobstructive alveolar hypoventilation: Secondary | ICD-10-CM

## 2024-06-19 NOTE — Progress Notes (Signed)
 See procedure note.
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
## 2024-06-24 ENCOUNTER — Ambulatory Visit: Payer: Self-pay | Admitting: Adult Health

## 2024-06-24 DIAGNOSIS — G4733 Obstructive sleep apnea (adult) (pediatric): Secondary | ICD-10-CM

## 2024-06-24 NOTE — Telephone Encounter (Signed)
 Zott, Glade Boring, Heather CROME, RN; [place], [place]; Darrel, Verneita Got It Thank You

## 2024-09-17 ENCOUNTER — Encounter: Payer: Self-pay | Admitting: Adult Health

## 2024-09-29 ENCOUNTER — Telehealth (INDEPENDENT_AMBULATORY_CARE_PROVIDER_SITE_OTHER): Admitting: Neurology

## 2024-09-29 ENCOUNTER — Telehealth: Payer: Self-pay | Admitting: Adult Health

## 2024-09-29 ENCOUNTER — Telehealth: Payer: Self-pay | Admitting: Neurology

## 2024-09-29 DIAGNOSIS — G4733 Obstructive sleep apnea (adult) (pediatric): Secondary | ICD-10-CM

## 2024-09-29 DIAGNOSIS — G4734 Idiopathic sleep related nonobstructive alveolar hypoventilation: Secondary | ICD-10-CM

## 2024-09-29 NOTE — Telephone Encounter (Signed)
 Pt was called, she has been scheduled for her 1 yr f/u(via my chart, given ok to do so by Dr Buck) appointment f/u with Duwaine, NP.  Pt has confirmed she will be in the state of [place].  Pt also mentioned that DME told her that in their system on the end of her last name is the #2.  Pt mentioned because she is unsure if that causes a conflict on their end or end of [facility]

## 2024-09-29 NOTE — Patient Instructions (Signed)
It was nice to see you again today. I am glad to hear, things are going well with your autoPAP therapy. You have adjusted well to treatment with your new machine, and you are compliant with it. You have also fulfilled the insurance-mandated compliance percentage, which is reassuring, so you can get ongoing supplies through your insurance. Please talk to your DME provider about getting replacement supplies on a regular basis. Please be sure to change your filter every month, your mask about every 3 months, hose about every 6 months, humidifier chamber about yearly. Some restrictions are imposed by your insurance carrier with regard to how frequently you can get certain supplies.  Your DME company can provide further details if necessary.   Please continue using your autoPAP regularly. While your insurance requires that you use PAP at least 4 hours each night on 70% of the nights, I recommend, that you not skip any nights and use it throughout the night if you can. Getting used to PAP and staying with the treatment long term does take time and patience and discipline. Untreated obstructive sleep apnea when it is moderate to severe can have an adverse impact on cardiovascular health and raise her risk for heart disease, arrhythmias, hypertension, congestive heart failure, stroke and diabetes. Untreated obstructive sleep apnea causes sleep disruption, nonrestorative sleep, and sleep deprivation. This can have an impact on your day to day functioning and cause daytime sleepiness and impairment of cognitive function, memory loss, mood disturbance, and problems focussing. Using PAP regularly can improve these symptoms.  As discussed, we will do an overnight oxygen level test, called ONO, and your DME company will call and set this up for one night, while you also use your autoPAP as usual. We will call you with the results. This is to make sure that your oxygen levels stay in the 90s, while you are treated with autoPAP  for your OSA. Remember, your oxygen levels dropped into the 60s during the home sleep test.   We can see you in 1 year, you can see one of our nurse practitioners as you are stable.
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
## 2024-09-29 NOTE — Telephone Encounter (Signed)
 Cpap DL received from [facility] and ready for Dr Buck to review.

## 2024-09-29 NOTE — Telephone Encounter (Signed)
 ..  Pt understands that although there may be some limitations with this type of visit, we will take all precautions to reduce any security or privacy concerns.  Pt understands that this will be treated like an in office visit and we will file with pt's insurance, and there may be a patient responsible charge related to this service. ? ?

## 2024-10-20 ENCOUNTER — Telehealth: Payer: Self-pay | Admitting: Neurology

## 2024-10-20 NOTE — Telephone Encounter (Signed)
 Spoke with patient and discussed ONO results and plan of care below as noted below by Dr Buck. The patient verbalized understanding and she confirmed she did her PAP machine that night and it was for only about 4 hours. She states she will try to use it longer. 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 I encouraged full compliance. Pt verbalized understanding and even stated she feels better when she uses her pap machine. She appreciated the call. Next f/u 09/29/25.

## 2024-10-20 NOTE — Telephone Encounter (Signed)
 I reviewed patient's pulse oximetry report.  She had an ONO on PAP therapy on 10/10/2024,, start time 4 AM, end time 8 AM, duration of test was 4 hours and 18 minutes.  Average oxygen saturation was 92.9%, nadir was 85%, time below or at 88% saturation was 2 minutes.  Average heart rate was 68 bpm. Please advise patient that her oxygen test while on PAP therapy was limited to about 4 hours and started at 4 AM and ended at 8 AM.  Please confirm that she used her PAP machine and that she only tested for 4 hours as the results indicate.  Based on the available test results, she does not have any significant oxygen drops while asleep, while using her PAP machine.  She can follow-up routinely in 1 year as scheduled.  Please encourage her to continue with her PAP machine with full compliance.

## 2024-11-12 ENCOUNTER — Encounter: Payer: Self-pay | Admitting: Neurology

## 2025-09-29 ENCOUNTER — Telehealth: Admitting: Adult Health
